# Patient Record
Sex: Female | Born: 1950 | Hispanic: No | Smoking: Former smoker
Health system: Southern US, Community
[De-identification: ages and names within clinical notes are randomized; demographics above are authoritative.]

## PROBLEM LIST (undated history)

## (undated) DIAGNOSIS — J309 Allergic rhinitis, unspecified: Secondary | ICD-10-CM

## (undated) DIAGNOSIS — F419 Anxiety disorder, unspecified: Secondary | ICD-10-CM

## (undated) DIAGNOSIS — I9789 Other postprocedural complications and disorders of the circulatory system, not elsewhere classified: Secondary | ICD-10-CM

## (undated) DIAGNOSIS — C3490 Malignant neoplasm of unspecified part of unspecified bronchus or lung: Secondary | ICD-10-CM

## (undated) DIAGNOSIS — E785 Hyperlipidemia, unspecified: Secondary | ICD-10-CM

## (undated) DIAGNOSIS — Z86711 Personal history of pulmonary embolism: Secondary | ICD-10-CM

## (undated) DIAGNOSIS — I10 Essential (primary) hypertension: Secondary | ICD-10-CM

## (undated) DIAGNOSIS — G4733 Obstructive sleep apnea (adult) (pediatric): Secondary | ICD-10-CM

## (undated) DIAGNOSIS — I4891 Unspecified atrial fibrillation: Secondary | ICD-10-CM

## (undated) DIAGNOSIS — J449 Chronic obstructive pulmonary disease, unspecified: Secondary | ICD-10-CM

## (undated) HISTORY — PX: OTHER SURGICAL HISTORY: SHX169

## (undated) HISTORY — PX: BREAST BIOPSY: SHX20

---

## 2011-07-28 HISTORY — PX: LOBECTOMY: SHX5089

## 2016-03-24 ENCOUNTER — Encounter: Payer: Self-pay | Admitting: Orthopaedic Surgery

## 2016-03-24 ENCOUNTER — Ambulatory Visit (INDEPENDENT_AMBULATORY_CARE_PROVIDER_SITE_OTHER): Payer: BLUE CROSS/BLUE SHIELD | Admitting: Orthopaedic Surgery

## 2016-03-24 VITALS — BP 124/84 | HR 116 | Temp 97.2°F | Ht 67.0 in | Wt 263.0 lb

## 2016-03-24 DIAGNOSIS — M25551 Pain in right hip: Secondary | ICD-10-CM | POA: Diagnosis not present

## 2016-03-24 MED ORDER — OXYCODONE-ACETAMINOPHEN 5-325 MG PO TABS: 1.0000 | ORAL_TABLET | ORAL | 0 refills | Status: DC | PRN

## 2016-03-24 NOTE — Progress Notes (Signed)
Subjective: My right hip hurts really bad    Patient ID: Anita Weeks, female    DOB: 01/25/1951, 65 y.o.   MRN: [id number]  HPI She has had about a month history of right hip pain.  She has no trauma.  She has been seen in [place] several times over the last month with x-rays done [id number] showing  Degenerative changes of both hips, no fracture.  She has been given Naprosyn, doses of prednisone in increasing doses up to 20 mgm dose packs with no help, heat/ice with no help, rubs with no help.  She can hardly stand at times secondary to hip pain.  She has no redness.  She has some pain to the mid thigh at times but mainly confined to the hip.  She is not getting any better.  She is tired of hurting.  She is not using a walker or crutches.  I have given Rx for Walker.  I have ordered MRI of the right hip to rule out possible avascular necrosis.  I have given pain medicine.  She works for [employer].   Review of Systems  HENT: Negative for congestion.   Respiratory: Negative for cough and shortness of breath.   Cardiovascular: Negative for chest pain and leg swelling.  Endocrine: Positive for cold intolerance.  Musculoskeletal: Positive for arthralgias, gait problem and joint swelling.  Allergic/Immunologic: Positive for environmental allergies.   No past medical history on file.  She has had surgery for stents for iliac arteries in past.  No current outpatient prescriptions on file prior to visit.   No current facility-administered medications on file prior to visit.     Social History   Social History  . Marital status: Widowed    Spouse name: N/A  . Number of children: N/A  . Years of education: N/A   Occupational History  . Not on file.   Social History Main Topics  . Smoking status: Never Smoker  . Smokeless tobacco: Never Used  . Alcohol use Not on file  . Drug use: Unknown  . Sexual activity: Not on file   Other Topics Concern  . Not on file   Social History  Narrative  . No narrative on file    History in family of heart disease and arthritis.  BP 124/84   Pulse (!) 116   Temp 97.2 F (36.2 C)   Ht '5\' 7"'$  (1.702 m)   Wt 263 lb (119.3 kg)   BMI 41.19 kg/m      Objective:   Physical Exam  Constitutional: She is oriented to person, place, and time. She appears well-developed and well-nourished.  HENT:  Head: Normocephalic and atraumatic.  Eyes: Conjunctivae and EOM are normal. Pupils are equal, round, and reactive to light.  Neck: Normal range of motion. Neck supple.  Cardiovascular: Normal rate, regular rhythm and intact distal pulses.   Pulmonary/Chest: Effort normal.  Abdominal: Soft.  Musculoskeletal: She exhibits tenderness (Right hip tender.  Limp to the right.  No redness.  NV intact.  Flexion 90, internal 5, external 10, abduction 30, adduction 30, pain to move hip on right.  Left hip negative.  Back negative.  ).  Neurological: She is alert and oriented to person, place, and time. She displays normal reflexes. No cranial nerve deficit. She exhibits normal muscle tone. Coordination normal.  Skin: Skin is warm and dry.  Psychiatric: She has a normal mood and affect. Her behavior is normal. Judgment and thought content normal.  I have reviewed the information from [place] and the x-rays and x-rays report.      Assessment & Plan:   Encounter Diagnosis  Name Primary?  . Right hip pain Yes   I have ordered a MRI to rule out possible avascular necrosis of the right hip early.  I have given pain medicine.  Precautions discussed.  Call if any problem.  Electronically Signed [name], MD 8/29/20174:00 PM

## 2016-03-31 ENCOUNTER — Other Ambulatory Visit: Payer: Self-pay | Admitting: *Deleted

## 2016-03-31 ENCOUNTER — Telehealth: Payer: Self-pay | Admitting: Orthopaedic Surgery

## 2016-03-31 ENCOUNTER — Ambulatory Visit: Payer: Medicare Other | Admitting: Orthopaedic Surgery

## 2016-03-31 DIAGNOSIS — M25551 Pain in right hip: Secondary | ICD-10-CM

## 2016-03-31 NOTE — Telephone Encounter (Signed)
Yes change to CT

## 2016-03-31 NOTE — Telephone Encounter (Signed)
Call from provider, [facility], Raquel Sarna, states unable to do MRI as scheduled 03/27/16, due to patient having stents; therefore, requests new order for CT, if Dr Luna Glasgow elects to have CT done.  Please advise - their phone# is  [phone], direct extension [phone]

## 2016-03-31 NOTE — Telephone Encounter (Signed)
WOULD YOU LIKE ME TO CHANGE ORDER TO CT?

## 2016-04-02 NOTE — Telephone Encounter (Signed)
Order changed and pre authorized and patient is aware to schedule

## 2016-04-07 ENCOUNTER — Telehealth: Payer: Self-pay | Admitting: Orthopaedic Surgery

## 2016-04-07 NOTE — Telephone Encounter (Signed)
Left message for patient that appointment has been scheduled for 04/09/16 at 5:00pm

## 2016-04-07 NOTE — Telephone Encounter (Signed)
Patient called and stated that [facility] would not let her set up her own appointment for CT.  Could you please do this and also give an appointment to return here.  Is she to go to [place] or [facility]?  Also, did you fax new CT order?  Thanks so much

## 2016-04-09 ENCOUNTER — Encounter: Payer: Self-pay | Admitting: Orthopaedic Surgery

## 2016-04-15 ENCOUNTER — Encounter: Payer: Self-pay | Admitting: Orthopaedic Surgery

## 2016-04-15 ENCOUNTER — Ambulatory Visit (INDEPENDENT_AMBULATORY_CARE_PROVIDER_SITE_OTHER): Payer: BLUE CROSS/BLUE SHIELD | Admitting: Orthopaedic Surgery

## 2016-04-15 ENCOUNTER — Ambulatory Visit (INDEPENDENT_AMBULATORY_CARE_PROVIDER_SITE_OTHER): Payer: Medicare Other

## 2016-04-15 VITALS — BP 139/73 | HR 83 | Temp 96.8°F | Ht 67.0 in | Wt 257.0 lb

## 2016-04-15 DIAGNOSIS — M25551 Pain in right hip: Secondary | ICD-10-CM

## 2016-04-15 MED ORDER — OXYCODONE-ACETAMINOPHEN 5-325 MG PO TABS: 1.0000 | ORAL_TABLET | Freq: Four times a day (QID) | ORAL | 0 refills | Status: AC | PRN

## 2016-04-15 NOTE — Progress Notes (Signed)
Patient Anita Weeks, female DOB:1951/04/02, 65 y.o. MEB:[id number]  Chief Complaint  Patient presents with  . Follow-up    right hip pain    HPI  Anita Weeks is a 65 y.o. female who has right hip pain. She had CT scan in [place] because she could not get a MRI with a stent in the artery.  CT scan showed DJD changes, no acute findings.  I have explained this to her.  She wants a lift chair, this was done. A Rx given.  Continue present medicine.  Use a walker or crutches.  Continue present medicine. HPI  Body mass index is 40.25 kg/m.  ROS  Review of Systems  HENT: Negative for congestion.   Respiratory: Negative for cough and shortness of breath.   Cardiovascular: Negative for chest pain and leg swelling.  Endocrine: Positive for cold intolerance.  Musculoskeletal: Positive for arthralgias, gait problem and joint swelling.  Allergic/Immunologic: Positive for environmental allergies.    No past medical history on file.  No past surgical history on file.  No family history on file.  Social History Social History  Substance Use Topics  . Smoking status: Never Smoker  . Smokeless tobacco: Never Used  . Alcohol use Not on file    No Known Allergies  Current Outpatient Prescriptions  Medication Sig Dispense Refill  . FUROSEMIDE PO Take by mouth.    Marland Kitchen MAGNESIUM PO Take by mouth.    . Multiple Vitamin (MULTIVITAMIN) tablet Take 1 tablet by mouth daily.    . Omega-3 Fatty Acids (FISH OIL PO) Take by mouth.    . OMEPRAZOLE PO Take by mouth.    . oxyCODONE-acetaminophen (PERCOCET/ROXICET) 5-325 MG tablet Take 1 tablet by mouth every 6 (six) hours as needed for moderate pain or severe pain (Must last 14 days.Do not drive or operate machinery while taking this medicine). 60 tablet 0  . POTASSIUM PO Take by mouth.     No current facility-administered medications for this visit.      Physical Exam  Blood pressure 139/73, pulse 83, temperature (!) 96.8 F (36 C),  height '5\' 7"'$  (1.702 m), weight 257 lb (116.6 kg).  Constitutional: overall normal hygiene, normal nutrition, well developed, normal grooming, normal body habitus. Assistive device:none  Musculoskeletal: gait and station Limp right, muscle tone and strength are normal, no tremors or atrophy is present.  .  Neurological: coordination overall normal.  Deep tendon reflex/nerve stretch intact.  Sensation normal.  Cranial nerves II-XII intact.   Skin:   Normal overall no scars, lesions, ulcers or rashes. No psoriasis.  Psychiatric: Alert and oriented x 3.  Recent memory intact, remote memory unclear.  Normal mood and affect. Well groomed.  Good eye contact.  Cardiovascular: overall no swelling, no varicosities, no edema bilaterally, normal temperatures of the legs and arms, no clubbing, cyanosis and good capillary refill.  Lymphatic: palpation is normal.  Hips have full ROM and no pain.  She limps to the right.  X-rays were done for leg length, reported separately.   The patient has been educated about the nature of the problem(s) and counseled on treatment options.  The patient appeared to understand what I have discussed and is in agreement with it.  Encounter Diagnosis  Name Primary?  . Right hip pain Yes    PLAN Call if any problems.  Precautions discussed.  Continue current medications.   Return to clinic 1 month   Electronically Signed [name], MD 9/20/20174:10 PM

## 2016-05-06 ENCOUNTER — Ambulatory Visit (INDEPENDENT_AMBULATORY_CARE_PROVIDER_SITE_OTHER): Payer: BLUE CROSS/BLUE SHIELD | Admitting: Orthopaedic Surgery

## 2016-05-06 VITALS — BP 124/81 | HR 84 | Temp 97.2°F | Resp 16 | Ht 67.0 in | Wt 257.0 lb

## 2016-05-06 DIAGNOSIS — M25551 Pain in right hip: Secondary | ICD-10-CM

## 2016-05-06 MED ORDER — PREDNISONE 10 MG (21) PO TBPK: ORAL_TABLET | ORAL | 1 refills | Status: AC

## 2016-05-06 NOTE — Progress Notes (Signed)
Patient Anita Weeks, female DOB:12/22/1950, 65 y.o. UUV:[id number]  Chief Complaint  Patient presents with  . Follow-up    Recheck on right hip.    HPI  Anita Weeks is a 65 y.o. female who has had more pain in the right hip.  She has no new trauma.  She has had CT of the hip which showed DJD changes, no fracture and no avascular necrosis.  She could not have a MRI as she has stent in artery.  She said she was doing fine until two days ago and her hip pain came back with a vengeance.  She has no new trauma.  She is in a wheelchair today. She is very emotional.  She says the pain medicine does not help and she is not taking it.  She is taking Mobic.  It helps some.  I will have her seen at [place] as she is not getting better and I do not know why she is having such pain.  I will try prednisone dose pack while awaiting her to see them. HPI  Body mass index is 40.25 kg/m.  ROS  Review of Systems  HENT: Negative for congestion.   Respiratory: Negative for cough and shortness of breath.   Cardiovascular: Negative for chest pain and leg swelling.  Endocrine: Positive for cold intolerance.  Musculoskeletal: Positive for arthralgias, gait problem and joint swelling.  Allergic/Immunologic: Positive for environmental allergies.    No past medical history on file.  No past surgical history on file.  Family history of heart disease and hypertension. Social History Social History  Substance Use Topics  . Smoking status: Never Smoker  . Smokeless tobacco: Never Used  . Alcohol use Not on file    No Known Allergies  Current Outpatient Prescriptions  Medication Sig Dispense Refill  . FUROSEMIDE PO Take by mouth.    Marland Kitchen MAGNESIUM PO Take by mouth.    . Multiple Vitamin (MULTIVITAMIN) tablet Take 1 tablet by mouth daily.    . Omega-3 Fatty Acids (FISH OIL PO) Take by mouth.    . OMEPRAZOLE PO Take by mouth.    . oxyCODONE-acetaminophen (PERCOCET/ROXICET) 5-325 MG tablet Take 1  tablet by mouth every 6 (six) hours as needed for moderate pain or severe pain (Must last 14 days.Do not drive or operate machinery while taking this medicine). 60 tablet 0  . POTASSIUM PO Take by mouth.    . predniSONE (STERAPRED UNI-PAK 21 TAB) 10 MG (21) TBPK tablet Take six pills the first day; then 5 pills the next day; then 4 pills the next day; then 3 pills the next day; then 2 pills the next day and the last day take one pill by mouth. 21 tablet 1   No current facility-administered medications for this visit.      Physical Exam  Blood pressure 124/81, pulse 84, temperature 97.2 F (36.2 C), resp. rate 16, height '5\' 7"'$  (1.702 m), weight 257 lb (116.6 kg).  Constitutional: overall normal hygiene, normal nutrition, well developed, normal grooming, normal body habitus. Assistive device:wheelchair  Musculoskeletal: gait and station Limp she cannot stand right hip hurts too much, muscle tone and strength are normal, no tremors or atrophy is present.  .  Neurological: coordination overall normal.  Deep tendon reflex/nerve stretch intact.  Sensation normal.  Cranial nerves II-XII intact.   Skin:   Normal overall no scars, lesions, ulcers or rashes. No psoriasis.  Psychiatric: Alert and oriented x 3.  Recent memory intact, remote  memory unclear.  Normal mood and affect. Well groomed.  Good eye contact.  Cardiovascular: overall no swelling, no varicosities, no edema bilaterally, normal temperatures of the legs and arms, no clubbing, cyanosis and good capillary refill.  Lymphatic: palpation is normal.  Right hip has good motion in wheelchair but she refuses to stand secondary to pain.  NV intact.  She has no distal edema.  She has no knee pain. She has no back pain.  The patient has been educated about the nature of the problem(s) and counseled on treatment options.  The patient appeared to understand what I have discussed and is in agreement with it.  Encounter Diagnosis  Name  Primary?  . Right hip pain Yes    PLAN Call if any problems.  Precautions discussed.  Continue current medications.   Return to clinic to [place]   Electronically Young, MD 10/11/20174:10 PM

## 2016-05-22 ENCOUNTER — Emergency Department (HOSPITAL_COMMUNITY): Payer: Medicare Other

## 2016-05-22 ENCOUNTER — Inpatient Hospital Stay (HOSPITAL_COMMUNITY)
Admission: AD | Admit: 2016-05-22 | Discharge: 2016-05-27 | DRG: 308 | Disposition: A | Discharge status: Home Health | Payer: Medicare Other | Attending: Internal Medicine | Admitting: Internal Medicine

## 2016-05-22 ENCOUNTER — Inpatient Hospital Stay (HOSPITAL_COMMUNITY): Payer: Medicare Other

## 2016-05-22 ENCOUNTER — Encounter (HOSPITAL_COMMUNITY): Payer: Self-pay | Admitting: Emergency Medicine

## 2016-05-22 DIAGNOSIS — I10 Essential (primary) hypertension: Secondary | ICD-10-CM | POA: Diagnosis present

## 2016-05-22 DIAGNOSIS — I4891 Unspecified atrial fibrillation: Secondary | ICD-10-CM | POA: Diagnosis not present

## 2016-05-22 DIAGNOSIS — I5043 Acute on chronic combined systolic (congestive) and diastolic (congestive) heart failure: Secondary | ICD-10-CM | POA: Diagnosis present

## 2016-05-22 DIAGNOSIS — J810 Acute pulmonary edema: Secondary | ICD-10-CM | POA: Diagnosis not present

## 2016-05-22 DIAGNOSIS — N179 Acute kidney failure, unspecified: Secondary | ICD-10-CM | POA: Diagnosis present

## 2016-05-22 DIAGNOSIS — Z79899 Other long term (current) drug therapy: Secondary | ICD-10-CM | POA: Diagnosis not present

## 2016-05-22 DIAGNOSIS — I5041 Acute combined systolic (congestive) and diastolic (congestive) heart failure: Secondary | ICD-10-CM

## 2016-05-22 DIAGNOSIS — M879 Osteonecrosis, unspecified: Secondary | ICD-10-CM | POA: Diagnosis present

## 2016-05-22 DIAGNOSIS — Z7951 Long term (current) use of inhaled steroids: Secondary | ICD-10-CM | POA: Diagnosis not present

## 2016-05-22 DIAGNOSIS — Z87891 Personal history of nicotine dependence: Secondary | ICD-10-CM | POA: Diagnosis not present

## 2016-05-22 DIAGNOSIS — I110 Hypertensive heart disease with heart failure: Secondary | ICD-10-CM | POA: Diagnosis present

## 2016-05-22 DIAGNOSIS — E669 Obesity, unspecified: Secondary | ICD-10-CM | POA: Diagnosis present

## 2016-05-22 DIAGNOSIS — J449 Chronic obstructive pulmonary disease, unspecified: Secondary | ICD-10-CM | POA: Diagnosis present

## 2016-05-22 DIAGNOSIS — Z6841 Body Mass Index (BMI) 40.0 and over, adult: Secondary | ICD-10-CM

## 2016-05-22 DIAGNOSIS — E876 Hypokalemia: Secondary | ICD-10-CM | POA: Diagnosis present

## 2016-05-22 DIAGNOSIS — Z8249 Family history of ischemic heart disease and other diseases of the circulatory system: Secondary | ICD-10-CM | POA: Diagnosis not present

## 2016-05-22 DIAGNOSIS — Z86711 Personal history of pulmonary embolism: Secondary | ICD-10-CM | POA: Diagnosis not present

## 2016-05-22 DIAGNOSIS — G4733 Obstructive sleep apnea (adult) (pediatric): Secondary | ICD-10-CM | POA: Diagnosis present

## 2016-05-22 DIAGNOSIS — Z823 Family history of stroke: Secondary | ICD-10-CM | POA: Diagnosis not present

## 2016-05-22 DIAGNOSIS — I509 Heart failure, unspecified: Secondary | ICD-10-CM

## 2016-05-22 DIAGNOSIS — Z85118 Personal history of other malignant neoplasm of bronchus and lung: Secondary | ICD-10-CM | POA: Diagnosis not present

## 2016-05-22 DIAGNOSIS — I481 Persistent atrial fibrillation: Secondary | ICD-10-CM

## 2016-05-22 DIAGNOSIS — E7800 Pure hypercholesterolemia, unspecified: Secondary | ICD-10-CM | POA: Diagnosis present

## 2016-05-22 DIAGNOSIS — I482 Chronic atrial fibrillation: Principal | ICD-10-CM | POA: Diagnosis present

## 2016-05-22 DIAGNOSIS — F419 Anxiety disorder, unspecified: Secondary | ICD-10-CM | POA: Diagnosis present

## 2016-05-22 DIAGNOSIS — Z23 Encounter for immunization: Secondary | ICD-10-CM | POA: Diagnosis not present

## 2016-05-22 DIAGNOSIS — I5031 Acute diastolic (congestive) heart failure: Secondary | ICD-10-CM | POA: Diagnosis not present

## 2016-05-22 DIAGNOSIS — J45909 Unspecified asthma, uncomplicated: Secondary | ICD-10-CM

## 2016-05-22 DIAGNOSIS — R0602 Shortness of breath: Secondary | ICD-10-CM | POA: Diagnosis present

## 2016-05-22 HISTORY — DX: Anxiety disorder, unspecified: F41.9

## 2016-05-22 HISTORY — DX: Hyperlipidemia, unspecified: E78.5

## 2016-05-22 HISTORY — DX: Unspecified atrial fibrillation: I48.91

## 2016-05-22 HISTORY — DX: Allergic rhinitis, unspecified: J30.9

## 2016-05-22 HISTORY — DX: Personal history of pulmonary embolism: Z86.711

## 2016-05-22 HISTORY — DX: Unspecified atrial fibrillation: I97.89

## 2016-05-22 HISTORY — DX: Malignant neoplasm of unspecified part of unspecified bronchus or lung: C34.90

## 2016-05-22 HISTORY — DX: Chronic obstructive pulmonary disease, unspecified: J44.9

## 2016-05-22 HISTORY — DX: Essential (primary) hypertension: I10

## 2016-05-22 HISTORY — DX: Obstructive sleep apnea (adult) (pediatric): G47.33

## 2016-05-22 LAB — PROTIME-INR
INR: 0.95
Prothrombin Time: 12.7 seconds (ref 11.4–15.2)

## 2016-05-22 LAB — CBC WITH DIFFERENTIAL/PLATELET
Basophils Absolute: 0 10*3/uL (ref 0.0–0.1)
Basophils Relative: 0 %
Eosinophils Absolute: 0.1 10*3/uL (ref 0.0–0.7)
Eosinophils Relative: 1 %
HEMATOCRIT: 41.1 % (ref 36.0–46.0)
Hemoglobin: 13.9 g/dL (ref 12.0–15.0)
LYMPHS PCT: 20 %
Lymphs Abs: 1.6 10*3/uL (ref 0.7–4.0)
MCH: 31.1 pg (ref 26.0–34.0)
MCHC: 33.8 g/dL (ref 30.0–36.0)
MCV: 91.9 fL (ref 78.0–100.0)
MONO ABS: 0.5 10*3/uL (ref 0.1–1.0)
MONOS PCT: 7 %
NEUTROS ABS: 5.7 10*3/uL (ref 1.7–7.7)
Neutrophils Relative %: 72 %
Platelets: 273 10*3/uL (ref 150–400)
RBC: 4.47 MIL/uL (ref 3.87–5.11)
RDW: 14.6 % (ref 11.5–15.5)
WBC: 7.9 10*3/uL (ref 4.0–10.5)

## 2016-05-22 LAB — HEPATIC FUNCTION PANEL
ALT: 57 U/L — ABNORMAL HIGH (ref 14–54)
AST: 61 U/L — AB (ref 15–41)
Albumin: 3.2 g/dL — ABNORMAL LOW (ref 3.5–5.0)
Alkaline Phosphatase: 94 U/L (ref 38–126)
BILIRUBIN DIRECT: 0.1 mg/dL (ref 0.1–0.5)
BILIRUBIN TOTAL: 0.8 mg/dL (ref 0.3–1.2)
Indirect Bilirubin: 0.7 mg/dL (ref 0.3–0.9)
Total Protein: 6.4 g/dL — ABNORMAL LOW (ref 6.5–8.1)

## 2016-05-22 LAB — BASIC METABOLIC PANEL
Anion gap: 6 (ref 5–15)
BUN: 16 mg/dL (ref 6–20)
CALCIUM: 8.7 mg/dL — AB (ref 8.9–10.3)
CO2: 26 mmol/L (ref 22–32)
CREATININE: 1.12 mg/dL — AB (ref 0.44–1.00)
Chloride: 107 mmol/L (ref 101–111)
GFR calc Af Amer: 58 mL/min — ABNORMAL LOW (ref >60)
GFR calc non Af Amer: 50 mL/min — ABNORMAL LOW (ref >60)
GLUCOSE: 114 mg/dL — AB (ref 65–99)
Potassium: 3.2 mmol/L — ABNORMAL LOW (ref 3.5–5.1)
Sodium: 139 mmol/L (ref 135–145)

## 2016-05-22 LAB — TROPONIN I
Troponin I: <0.03 ng/mL (ref <0.03)
Troponin I: <0.03 ng/mL (ref <0.03)
Troponin I: <0.03 ng/mL (ref <0.03)

## 2016-05-22 LAB — TSH: TSH: 0.955 u[IU]/mL (ref 0.350–4.500)

## 2016-05-22 MED ORDER — ACETAMINOPHEN 325 MG PO TABS: 650.0000 mg | ORAL_TABLET | Freq: Four times a day (QID) | ORAL | Status: DC | PRN

## 2016-05-22 MED ORDER — ACETAMINOPHEN 650 MG RE SUPP
650.0000 mg | Freq: Four times a day (QID) | RECTAL | Status: DC | PRN
Start: 2016-05-22 — End: 2016-05-27

## 2016-05-22 MED ORDER — PANTOPRAZOLE SODIUM 40 MG PO TBEC
40.0000 mg | DELAYED_RELEASE_TABLET | Freq: Every day | ORAL | Status: DC
  Administered 2016-05-22 – 2016-05-27 (×6): 40 mg via ORAL
  Filled 2016-05-22 (×6): qty 1

## 2016-05-22 MED ORDER — SODIUM CHLORIDE 0.9% FLUSH
3.0000 mL | Freq: Two times a day (BID) | INTRAVENOUS | Status: DC
  Administered 2016-05-24 – 2016-05-27 (×6): 3 mL via INTRAVENOUS

## 2016-05-22 MED ORDER — ALBUTEROL SULFATE (2.5 MG/3ML) 0.083% IN NEBU
5.0000 mg | INHALATION_SOLUTION | Freq: Once | RESPIRATORY_TRACT | Status: DC
  Filled 2016-05-22: qty 6

## 2016-05-22 MED ORDER — ADULT MULTIVITAMIN W/MINERALS CH
1.0000 | ORAL_TABLET | Freq: Every day | ORAL | Status: DC
  Administered 2016-05-23 – 2016-05-27 (×5): 1 via ORAL
  Filled 2016-05-22 (×5): qty 1

## 2016-05-22 MED ORDER — POTASSIUM CHLORIDE CRYS ER 20 MEQ PO TBCR
40.0000 meq | EXTENDED_RELEASE_TABLET | Freq: Every day | ORAL | Status: DC
  Administered 2016-05-22 – 2016-05-24 (×3): 40 meq via ORAL
  Filled 2016-05-22 (×3): qty 2

## 2016-05-22 MED ORDER — IPRATROPIUM BROMIDE 0.02 % IN SOLN
0.5000 mg | Freq: Once | RESPIRATORY_TRACT | Status: DC
  Filled 2016-05-22: qty 2.5

## 2016-05-22 MED ORDER — UMECLIDINIUM BROMIDE 62.5 MCG/INH IN AEPB
1.0000 | INHALATION_SPRAY | Freq: Every day | RESPIRATORY_TRACT | Status: DC
  Administered 2016-05-23 – 2016-05-27 (×5): 1 via RESPIRATORY_TRACT
  Filled 2016-05-22: qty 7

## 2016-05-22 MED ORDER — CARVEDILOL 12.5 MG PO TABS
12.5000 mg | ORAL_TABLET | Freq: Two times a day (BID) | ORAL | Status: DC
  Administered 2016-05-23 – 2016-05-25 (×5): 12.5 mg via ORAL
  Filled 2016-05-22 (×5): qty 1

## 2016-05-22 MED ORDER — POLYETHYL GLYCOL-PROPYL GLYCOL 0.4-0.3 % OP GEL
Freq: Every day | OPHTHALMIC | Status: DC
  Filled 2016-05-22: qty 10

## 2016-05-22 MED ORDER — POTASSIUM 99 MG PO TABS: 1.0000 | ORAL_TABLET | Freq: Every day | ORAL | Status: DC

## 2016-05-22 MED ORDER — ONDANSETRON HCL 4 MG/2ML IJ SOLN: 4.0000 mg | Freq: Four times a day (QID) | INTRAMUSCULAR | Status: DC | PRN

## 2016-05-22 MED ORDER — INFLUENZA VAC SPLIT QUAD 0.5 ML IM SUSY
0.5000 mL | PREFILLED_SYRINGE | INTRAMUSCULAR | Status: AC
  Administered 2016-05-23: 0.5 mL via INTRAMUSCULAR
  Filled 2016-05-22: qty 0.5

## 2016-05-22 MED ORDER — CARVEDILOL 12.5 MG PO TABS
12.5000 mg | ORAL_TABLET | Freq: Two times a day (BID) | ORAL | Status: DC
  Administered 2016-05-22: 12.5 mg via ORAL
  Filled 2016-05-22: qty 1

## 2016-05-22 MED ORDER — SODIUM CHLORIDE 0.9 % IV SOLN
INTRAVENOUS | Status: DC
  Administered 2016-05-22: 16:00:00 via INTRAVENOUS

## 2016-05-22 MED ORDER — NAPHAZOLINE-GLYCERIN 0.012-0.2 % OP SOLN
1.0000 [drp] | Freq: Four times a day (QID) | OPHTHALMIC | Status: DC | PRN
  Filled 2016-05-22: qty 15

## 2016-05-22 MED ORDER — POTASSIUM CHLORIDE CRYS ER 20 MEQ PO TBCR
40.0000 meq | EXTENDED_RELEASE_TABLET | Freq: Once | ORAL | Status: AC
  Administered 2016-05-22: 40 meq via ORAL
  Filled 2016-05-22: qty 2

## 2016-05-22 MED ORDER — DILTIAZEM HCL 100 MG IV SOLR
INTRAVENOUS | Status: AC
  Administered 2016-05-22: 5 mg/h via INTRAVENOUS
  Filled 2016-05-22: qty 100

## 2016-05-22 MED ORDER — APIXABAN 5 MG PO TABS
5.0000 mg | ORAL_TABLET | Freq: Two times a day (BID) | ORAL | Status: DC
  Administered 2016-05-22 – 2016-05-27 (×10): 5 mg via ORAL
  Filled 2016-05-22 (×10): qty 1

## 2016-05-22 MED ORDER — FUROSEMIDE 10 MG/ML IJ SOLN
40.0000 mg | Freq: Two times a day (BID) | INTRAMUSCULAR | Status: DC
  Administered 2016-05-22 – 2016-05-24 (×4): 40 mg via INTRAVENOUS
  Filled 2016-05-22 (×4): qty 4

## 2016-05-22 MED ORDER — MAGNESIUM OXIDE 400 (241.3 MG) MG PO TABS
200.0000 mg | ORAL_TABLET | Freq: Every day | ORAL | Status: DC
Start: 2016-05-23 — End: 2016-05-23
  Administered 2016-05-23: 200 mg via ORAL
  Filled 2016-05-22: qty 1

## 2016-05-22 MED ORDER — OMEGA-3-ACID ETHYL ESTERS 1 G PO CAPS
1.0000 g | ORAL_CAPSULE | Freq: Every day | ORAL | Status: DC
  Administered 2016-05-23 – 2016-05-27 (×5): 1 g via ORAL
  Filled 2016-05-22 (×5): qty 1

## 2016-05-22 MED ORDER — HYDROCODONE-ACETAMINOPHEN 5-325 MG PO TABS
1.0000 | ORAL_TABLET | ORAL | Status: DC | PRN
  Administered 2016-05-25 – 2016-05-26 (×3): 1 via ORAL
  Filled 2016-05-22 (×3): qty 1

## 2016-05-22 MED ORDER — DILTIAZEM LOAD VIA INFUSION
10.0000 mg | Freq: Once | INTRAVENOUS | Status: AC
  Administered 2016-05-22: 10 mg via INTRAVENOUS
  Filled 2016-05-22: qty 10

## 2016-05-22 MED ORDER — HEPARIN SODIUM (PORCINE) 5000 UNIT/ML IJ SOLN
5000.0000 [IU] | Freq: Three times a day (TID) | INTRAMUSCULAR | Status: DC
  Administered 2016-05-22: 5000 [IU] via SUBCUTANEOUS
  Filled 2016-05-22: qty 1

## 2016-05-22 MED ORDER — ONDANSETRON HCL 4 MG PO TABS: 4.0000 mg | ORAL_TABLET | Freq: Four times a day (QID) | ORAL | Status: DC | PRN

## 2016-05-22 MED ORDER — DILTIAZEM HCL 100 MG IV SOLR
5.0000 mg/h | INTRAVENOUS | Status: DC
  Administered 2016-05-22 (×3): 5 mg/h via INTRAVENOUS
  Administered 2016-05-23: 10 mg/h via INTRAVENOUS
  Filled 2016-05-22 (×2): qty 100

## 2016-05-22 NOTE — H&P (Signed)
History and Physical    Anita Weeks:[id number] DOB: [birth year] DOA: 05/22/2016  PCP: No PCP Per Patient  Patient coming from: Home  Chief Complaint: SOB  HPI: Anita Weeks is a 65 y.o. female with medical history significant of LLL NSCLC with removal of 2013, has COPD, ex-smoker came to the hospital complaining about shortness of breath. Patient reported that she was having SOB for the past several days, seen in urgent care center several days ago, continues to have SOB and lower extremity edema so she came in today from work for further evaluation. She was found to be on A. fib with RVR.  ED Course:  Vitals: Tachycardia otherwise WNL Labs: Potassium of 3.2, creatinine is 1.12 Imaging: CXR showed bilateral pleural fluids Interventions: Started on Cardizem drip  Review of Systems:  Constitutional: negative for anorexia, fevers and sweats Eyes: negative for irritation, redness and visual disturbance Ears, nose, mouth, throat, and face: negative for earaches, epistaxis, nasal congestion and sore throat Respiratory: negative for cough, dyspnea on exertion, sputum and wheezing Cardiovascular: SOB, lower extremity edema Gastrointestinal: negative for abdominal pain, constipation, diarrhea, melena, nausea and vomiting Genitourinary:negative for dysuria, frequency and hematuria Hematologic/lymphatic: negative for bleeding, easy bruising and lymphadenopathy Musculoskeletal:negative for arthralgias, muscle weakness and stiff joints Neurological: negative for coordination problems, gait problems, headaches and weakness Endocrine: negative for diabetic symptoms including polydipsia, polyuria and weight loss Allergic/Immunologic: negative for anaphylaxis, hay fever and urticaria  Past Medical History:  Diagnosis Date  . COPD (chronic obstructive pulmonary disease) ([place])   . High cholesterol   . Hypertension     Past Surgical History:  Procedure Laterality Date  . CESAREAN SECTION      . LOBECTOMY Left 2013   lower     reports that she has quit smoking. She has never used smokeless tobacco. She reports that she does not drink alcohol or use drugs.  No Known Allergies  Family history No premature cardiac disease  Prior to Admission medications   Medication Sig Start Date End Date Taking? Authorizing Provider  amLODipine (NORVASC) 5 MG tablet 1 tablet daily. 05/11/16  Yes Historical Provider, MD  fluticasone furoate-vilanterol (BREO ELLIPTA) 200-25 MCG/INH AEPB 1 puff daily. 04/16/16  Yes Historical Provider, MD  losartan (COZAAR) 100 MG tablet 1 tablet daily. 05/11/16  Yes Historical Provider, MD  MAGNESIUM PO Take 100 mg by mouth daily.    Yes Historical Provider, MD  metoprolol (LOPRESSOR) 50 MG tablet 1 tablet 3 (three) times daily. 05/10/16  Yes Historical Provider, MD  Multiple Vitamin (MULTIVITAMIN) tablet Take 1 tablet by mouth daily.   Yes Historical Provider, MD  Omega-3 Fatty Acids (FISH OIL PO) Take 1 tablet by mouth daily.    Yes Historical Provider, MD  omeprazole (PRILOSEC) 20 MG capsule Take 1 capsule by mouth at bedtime.    Yes Historical Provider, MD  Polyethyl Glycol-Propyl Glycol (SYSTANE OP) Apply 1 drop to eye daily.   Yes Historical Provider, MD  Potassium 99 MG TABS Take 1 tablet by mouth daily.    Yes Historical Provider, MD  umeclidinium bromide (INCRUSE ELLIPTA) 62.5 MCG/INH AEPB 1 puff daily. 04/15/16  Yes Historical Provider, MD  oxyCODONE-acetaminophen (PERCOCET/ROXICET) 5-325 MG tablet Take 1 tablet by mouth every 6 (six) hours as needed for moderate pain or severe pain (Must last 14 days.Do not drive or operate machinery while taking this medicine). 04/15/16   [name], MD  predniSONE (STERAPRED UNI-PAK 21 TAB) 10 MG (21) TBPK tablet Take six pills  the first day; then 5 pills the next day; then 4 pills the next day; then 3 pills the next day; then 2 pills the next day and the last day take one pill by mouth. 05/06/16   [name], MD     Physical Exam:  Vitals:   05/22/16 1305 05/22/16 1330 05/22/16 1414 05/22/16 1430  BP: (!) 139/117 153/98 125/81 129/83  Pulse: 116 95 103 102  Resp: '23 23 24 23  '$ Temp:      SpO2: 95% 95% 97% 93%  Weight:      Height:        Constitutional: NAD, calm, comfortable Eyes: PERRL, lids and conjunctivae normal ENMT: Mucous membranes are moist. Posterior pharynx clear of any exudate or lesions.Normal dentition.  Neck: normal, supple, no masses, no thyromegaly Respiratory: clear to auscultation bilaterally, no wheezing, no crackles. Normal respiratory effort. No accessory muscle use.  Cardiovascular: Regular rate and rhythm, no murmurs / rubs / gallops. No extremity edema. 2+ pedal pulses. No carotid bruits.  Abdomen: no tenderness, no masses palpated. No hepatosplenomegaly. Bowel sounds positive.  Musculoskeletal: no clubbing / cyanosis. No joint deformity upper and lower extremities. Good ROM, no contractures. Normal muscle tone.  Skin: no rashes, lesions, ulcers. No induration Neurologic: CN 2-12 grossly intact. Sensation intact, DTR normal. Strength 5/5 in all 4.  Psychiatric: Normal judgment and insight. Alert and oriented x 3. Normal mood.   Labs on Admission: I have personally reviewed following labs and imaging studies  CBC:  Recent Labs Lab 05/22/16 1149  WBC 7.9  NEUTROABS 5.7  HGB 13.9  HCT 41.1  MCV 91.9  PLT 176   Basic Metabolic Panel:  Recent Labs Lab 05/22/16 1149  NA 139  K 3.2*  CL 107  CO2 26  GLUCOSE 114*  BUN 16  CREATININE 1.12*  CALCIUM 8.7*   GFR: Estimated Creatinine Clearance: 64 mL/min (by C-G formula based on SCr of 1.12 mg/dL (H)). Liver Function Tests:  Recent Labs Lab 05/22/16 1205  AST 61*  ALT 57*  ALKPHOS 94  BILITOT 0.8  PROT 6.4*  ALBUMIN 3.2*   No results for input(s): LIPASE, AMYLASE in the last 168 hours. No results for input(s): AMMONIA in the last 168 hours. Coagulation Profile: No results for input(s): INR,  PROTIME in the last 168 hours. Cardiac Enzymes:  Recent Labs Lab 05/22/16 1205  TROPONINI <0.03   BNP (last 3 results) No results for input(s): PROBNP in the last 8760 hours. HbA1C: No results for input(s): HGBA1C in the last 72 hours. CBG: No results for input(s): GLUCAP in the last 168 hours. Lipid Profile: No results for input(s): CHOL, HDL, LDLCALC, TRIG, CHOLHDL, LDLDIRECT in the last 72 hours. Thyroid Function Tests: No results for input(s): TSH, T4TOTAL, FREET4, T3FREE, THYROIDAB in the last 72 hours. Anemia Panel: No results for input(s): VITAMINB12, FOLATE, FERRITIN, TIBC, IRON, RETICCTPCT in the last 72 hours. Urine analysis: No results found for: COLORURINE, APPEARANCEUR, LABSPEC, PHURINE, GLUCOSEU, HGBUR, BILIRUBINUR, KETONESUR, PROTEINUR, UROBILINOGEN, NITRITE, LEUKOCYTESUR Sepsis Labs: !!!!!!!!!!!!!!!!!!!!!!!!!!!!!!!!!!!!!!!!!!!! Invalid input(s): PROCALCITONIN, LACTICIDVEN No results found for this or any previous visit (from the past 240 hour(s)).   Radiological Exams on Admission: Dg Chest 2 View  Result Date: 05/22/2016 CLINICAL DATA:  Shortness of breath since last night with productive cough. EXAM: CHEST  2 VIEW COMPARISON:  None. FINDINGS: Lungs are adequately inflated and demonstrate small amount of bilateral pleural fluid. There is patchy airspace opacification over the right middle lobe/ lingula possibly a pneumonia. Mild  prominence of the perihilar markings. Mild cardiomegaly. Minimal calcified plaque over the aortic arch. There are degenerative changes of the spine. IMPRESSION: Patchy midlung opacification suggesting a pneumonia. Small amount of bilateral pleural fluid. Mild cardiomegaly with suggestion mild vascular congestion. Electronically Signed   By: Marin Olp M.D.   On: 05/22/2016 12:16    EKG: Independently reviewed.   Assessment/Plan Principal Problem:   Atrial fibrillation (HCC) Active Problems:   CHF (congestive heart failure) (HCC)    Acute pulmonary edema (HCC)   History of lung cancer   Asthma   Essential hypertension    Atrial fibrillation -Presented with A. fib with RVR, heart rate was up to 125. -Patient started on Cardizem drip in the emergency department, admitted to stepdown for further management. -Check 2-D echocardiogram, TSH and treat fluid overload.  Acute congestive heart failure -New-onset bibasilar pulmonary edema, lower extremity edema consistent with acute CHF. Check BMP. -Started on IV Lasix, follow intake/output closely. Continue metoprolol -Check 2-D echocardiogram. Serial cardiac enzymes to rule out ACS. -Daily weight, follow renal function closely.  Acute bibasilar pulmonary edema -This is likely secondary to acute congestive heart failure, patient started on diuresis. -Check 2-D echo.  Essential hypertension -Continue metoprolol, hold Cozaar because of concurrent diuresis. -Hold Norvasc, patient is on Cardizem drip.  History of lung cancer -Follow-up with Dr. Wynelle Cleveland as outpatient, no recent problems.   DVT prophylaxis: SQ Heparin Code Status: full code Family Communication: Plan D/W patient in presence of her friend at bedside Disposition Plan: [place] called:  Admission status: Stepdown, inpatient   [facility] A MD [facility] Pager [phone]  If 7PM-7AM, please contact night-coverage [email or web address] Password [email or web address]  05/22/2016, 3:02 PM

## 2016-05-22 NOTE — ED Provider Notes (Signed)
[place] DEPT Provider Note   CSN: [id number] Arrival date & time: 05/22/16  1139  By signing my name below, I, Anita Weeks, attest that this documentation has been prepared under the direction and in the presence of Anita Ferguson, MD. Electronically Signed: Rayna Weeks, ED Scribe. 05/22/16. 11:55 AM.   History   Chief Complaint Chief Complaint  Patient presents with  . Shortness of Breath    HPI HPI Comments: Anita Weeks is a 65 y.o. female with a h/o COPD who presents to the Emergency Department complaining of worsening, moderate, SOB x 1 day. She has been using her nebulizer and albuterol inhaler prn w/o significant relief. Pt also notes palpitations but states she has been dx with a-fib in the past and that the "feeling is nml for her". She reports taking medications for her a-fib but is not on anticoagulation, including aspirin. Pt has a f/u appointment with her cardiologist on 05/26/2016. No other associated symptoms at this time.   Cardiologist: Dr. Orpah Greek [facility])  The history is provided by the patient and medical records. No language interpreter was used.  Shortness of Breath  This is a chronic problem. The average episode lasts 1 day. The current episode started yesterday. The problem has been gradually worsening. Pertinent negatives include no headaches, no cough, no chest pain, no abdominal pain and no rash. Treatments tried: albuterol and nebulizer treatments. The treatment provided no relief. Associated medical issues include COPD.    Past Medical History:  Diagnosis Date  . COPD (chronic obstructive pulmonary disease) ([place])   . High cholesterol   . Hypertension     There are no active problems to display for this patient.   Past Surgical History:  Procedure Laterality Date  . CESAREAN SECTION    . LOBECTOMY Left 2013   lower    OB History    No data available     Home Medications    Prior to Admission medications   Medication  Sig Start Date End Date Taking? Authorizing Provider  FUROSEMIDE PO Take by mouth.    Historical Provider, MD  MAGNESIUM PO Take by mouth.    Historical Provider, MD  Multiple Vitamin (MULTIVITAMIN) tablet Take 1 tablet by mouth daily.    Historical Provider, MD  Omega-3 Fatty Acids (FISH OIL PO) Take by mouth.    Historical Provider, MD  OMEPRAZOLE PO Take by mouth.    Historical Provider, MD  oxyCODONE-acetaminophen (PERCOCET/ROXICET) 5-325 MG tablet Take 1 tablet by mouth every 6 (six) hours as needed for moderate pain or severe pain (Must last 14 days.Do not drive or operate machinery while taking this medicine). 04/15/16   [name], MD  POTASSIUM PO Take by mouth.    Historical Provider, MD  predniSONE (STERAPRED UNI-PAK 21 TAB) 10 MG (21) TBPK tablet Take six pills the first day; then 5 pills the next day; then 4 pills the next day; then 3 pills the next day; then 2 pills the next day and the last day take one pill by mouth. 05/06/16   [name], MD    Family History No family history on file.  Social History Social History  Substance Use Topics  . Smoking status: Former Research scientist (life sciences)  . Smokeless tobacco: Never Used  . Alcohol use No     Allergies   Review of patient's allergies indicates no known allergies.   Review of Systems Review of Systems  Constitutional: Negative for appetite change and fatigue.  HENT: Negative for congestion,  ear discharge and sinus pressure.   Eyes: Negative for discharge.  Respiratory: Positive for shortness of breath. Negative for cough.   Cardiovascular: Positive for palpitations. Negative for chest pain.  Gastrointestinal: Negative for abdominal pain and diarrhea.  Genitourinary: Negative for frequency and hematuria.  Musculoskeletal: Negative for back pain.  Skin: Negative for rash.  Neurological: Negative for seizures and headaches.  Psychiatric/Behavioral: Negative for hallucinations.   Physical Exam Updated Vital Signs BP (!)  156/118   Pulse (!) 157   Temp 97.5 F (36.4 C)   Resp (!) 35   Ht '5\' 7"'$  (1.702 m)   Wt 255 lb (115.7 kg)   SpO2 93%   BMI 39.94 kg/m   Physical Exam  Constitutional: She is oriented to person, place, and time. She appears well-developed.  HENT:  Head: Normocephalic.  Eyes: Conjunctivae and EOM are normal. No scleral icterus.  Neck: Neck supple. No thyromegaly present.  Cardiovascular: An irregularly irregular rhythm present. Tachycardia present.  Exam reveals no gallop and no friction rub.   No murmur heard. Pulmonary/Chest: No stridor. She has no wheezes. She has no rales. She exhibits no tenderness.  Abdominal: She exhibits no distension. There is no tenderness. There is no rebound.  Musculoskeletal: Normal range of motion. She exhibits no edema.  Lymphadenopathy:    She has no cervical adenopathy.  Neurological: She is oriented to person, place, and time. She exhibits normal muscle tone. Coordination normal.  Skin: No rash noted. No erythema.  Psychiatric: She has a normal mood and affect. Her behavior is normal.   ED Treatments / Results  Labs (all labs ordered are listed, but only abnormal results are displayed) Labs Reviewed  CBC WITH DIFFERENTIAL/PLATELET  BASIC METABOLIC PANEL    EKG  EKG Interpretation None       Radiology No results found.  Procedures Procedures  DIAGNOSTIC STUDIES: Oxygen Saturation is 93% on RA, adequate by my interpretation.    COORDINATION OF CARE: 11:52 AM Discussed next steps with pt. Pt verbalized understanding and is agreeable with the plan.    Medications Ordered in ED Medications  albuterol (PROVENTIL) (2.5 MG/3ML) 0.083% nebulizer solution 5 mg (not administered)  ipratropium (ATROVENT) nebulizer solution 0.5 mg (not administered)    Initial Impression / Assessment and Plan / ED Course  I have reviewed the triage vital signs and the nursing notes.  Pertinent labs & imaging results that were available during my care  of the patient were reviewed by me and considered in my medical decision making (see chart for details).  Clinical Course  CRITICAL CARE Performed by: Menna Abeln L Total critical care time: 45 minutes Critical care time was exclusive of separately billable procedures and treating other patients. Critical care was necessary to treat or prevent imminent or life-threatening deterioration. Critical care was time spent personally by me on the following activities: development of treatment plan with patient and/or surrogate as well as nursing, discussions with consultants, evaluation of patient's response to treatment, examination of patient, obtaining history from patient or surrogate, ordering and performing treatments and interventions, ordering and review of laboratory studies, ordering and review of radiographic studies, pulse oximetry and re-evaluation of patient's condition.   Final Clinical Impressions(s) / ED Diagnoses   Final diagnoses:  None  Patient with rapid A. fib. Patient will be admitted to stepdown on a diltiazem drip  The chart was scribed for me under my direct supervision.  I personally performed the history, physical, and medical decision making and all procedures  in the evaluation of this patient..   New Prescriptions New Prescriptions   No medications on file     Anita Ferguson, MD 05/22/16 1410

## 2016-05-22 NOTE — ED Notes (Signed)
Pt reports she has been having a productive cough with yellow and bld tinged sputum X4 days. States cough is mainly in the morning, denies fever, chills, CP. Reports it feels like a COPD exacerbation.

## 2016-05-22 NOTE — Consult Note (Signed)
Requesting provider: Verlee Monte, MD Consulting cardiologist: Satira Sark, MD  Reason for consultation: Atrial fibrillation  Clinical Summary Anita Weeks is a 65 y.o.female with past medical history outlined below, currently admitted to the hospital with a one-week history of progressive shortness of breath, leg edema and intermittent coughing. She states she was seen at an urgent care today and sent to the ER, documented to have rapid atrial fibrillation by ECG.  I reviewed her records and updated the chart, she is followed at [facility] by various providers with prior history of lung cancer, also recently seen by an orthopedist due to hip pain and right sided avascular necrosis with plan for surgery sometime in December. Cardiac history is somewhat unclear, there is mention that she had postoperative atrial fibrillation in the past. No definitive history of CAD or myocardial infarction, no history of cardiomyopathy. She has not been anticoagulated long-term.  CHADSVASC score is at least 3 based on available information. She does have a history of hypertension and takes Norvasc, Cozaar, and Lopressor at home. She has been placed on intravenous diltiazem per hospital team, transitioned from Lopressor to [place], and at the present time has been taken off of both Norvasc and Cozaar. She has also been placed on IV Lasix.  Echocardiogram is ordered and pending for assessment of cardiac structure and function. Her initial cardiac markers are normal.  No Known Allergies  Medications Scheduled Medications: . carvedilol  12.5 mg Oral BID WC  . furosemide  40 mg Intravenous BID  . heparin  5,000 Units Subcutaneous Q8H  . [START ON 05/23/2016] Influenza vac split quadrivalent PF  0.5 mL Intramuscular Tomorrow-1000  . [START ON 05/23/2016] magnesium oxide  200 mg Oral Daily  . [START ON 05/23/2016] multivitamin with minerals  1 tablet Oral Daily  . [START ON 05/23/2016] omega-3 acid ethyl esters   1 g Oral Daily  . pantoprazole  40 mg Oral Daily  . potassium chloride  40 mEq Oral Daily  . sodium chloride flush  3 mL Intravenous Q12H  . [START ON 05/23/2016] umeclidinium bromide  1 puff Inhalation Daily    Infusions: . sodium chloride 10 mL/hr at 05/22/16 1617  . diltiazem (CARDIZEM) infusion 10 mg/hr (05/22/16 1328)    PRN Medications: acetaminophen **OR** acetaminophen, HYDROcodone-acetaminophen, naphazoline-glycerin, ondansetron **OR** ondansetron (ZOFRAN) IV   Past Medical History:  Diagnosis Date  . Allergic rhinitis   . Anxiety   . COPD (chronic obstructive pulmonary disease) (Anita Weeks)   . Essential hypertension   . History of pulmonary embolus (PE)   . Hyperlipidemia   . Lung cancer (Anita Weeks)    Non-small cell lung cancer, left lower lobe - surgery 2013 ([facility])  . OSA (obstructive sleep apnea)   . Postoperative atrial fibrillation [facility])     Past Surgical History:  Procedure Laterality Date  . BREAST BIOPSY Left   . CESAREAN SECTION    . LOBECTOMY Left 2013   lower  . Reversal of tubal ligation      Family History  Problem Relation Age of Onset  . Heart disease Father   . Stroke Father   . Thyroid disease Sister     Social History [name] reports that she has quit smoking. She has never used smokeless tobacco. [name] reports that she does not drink alcohol.  Review of Systems Complete review of systems negative except as otherwise outlined in the clinical summary and also the following. No chest pain, no recent sense of palpitations. No  syncope.  Physical Examination Blood pressure 120/83, pulse 96, temperature 97.5 F (36.4 C), resp. rate (!) 25, height '5\' 7"'$  (1.702 m), weight 258 lb 6.1 oz (117.2 kg), SpO2 93 %.  Telemetry: Atrial fibrillation with heart rate in the 90-100 range.  Gen.: Obese woman in no acute distress. HEENT: Conjunctiva and lids normal, oropharynx clear. Neck: Supple, no elevated JVP or carotid bruits, no thyromegaly. Lungs:  Diminished breath sounds with a few crackles at the bases, nonlabored breathing at rest. Cardiac: Distant, irregularly irregular, no S3 or significant systolic murmur, no pericardial rub. Abdomen: Protuberant, nontender, bowel sounds present, no guarding or rebound. Extremities: 2+ lower leg edema, distal pulses 1-2+. Skin: Warm and dry. Musculoskeletal: No kyphosis. Neuropsychiatric: Alert and oriented x3, affect grossly appropriate.  Lab Results  Basic Metabolic Panel:  Recent Labs Lab 05/22/16 1149  NA 139  K 3.2*  CL 107  CO2 26  GLUCOSE 114*  BUN 16  CREATININE 1.12*  CALCIUM 8.7*    Liver Function Tests:  Recent Labs Lab 05/22/16 1205  AST 61*  ALT 57*  ALKPHOS 94  BILITOT 0.8  PROT 6.4*  ALBUMIN 3.2*    CBC:  Recent Labs Lab 05/22/16 1149  WBC 7.9  NEUTROABS 5.7  HGB 13.9  HCT 41.1  MCV 91.9  PLT 273    Cardiac Enzymes:  Recent Labs Lab 05/22/16 1205 05/22/16 1539  TROPONINI <0.03 <0.03    ECG I personally reviewed the tracing from today which shows atrial fibrillation at 155 bpm with nonspecific T-wave changes.  Imaging  Chest x-ray 05/22/2016: FINDINGS: Lungs are adequately inflated and demonstrate small amount of bilateral pleural fluid. There is patchy airspace opacification over the right middle lobe/ lingula possibly a pneumonia. Mild prominence of the perihilar markings. Mild cardiomegaly. Minimal calcified plaque over the aortic arch. There are degenerative changes of the spine.  IMPRESSION: Patchy midlung opacification suggesting a pneumonia. Small amount of bilateral pleural fluid.  Mild cardiomegaly with suggestion mild vascular congestion.  Impression  1. Presentation with rapid atrial fibrillation, onset uncertain but possibly within the last week in light of recent worsening shortness of breath, intermittent cough, and leg edema. Chest x-ray shows patchy midlung opacification suggesting possible infiltrate, but  she is not afebrile and does not have a leukocytosis. Vascular congestion also points towards volume overload and she may well have associated diastolic heart failure. CHADSVASC score is 3 based on available information. She may have a history of postoperative atrial fibrillation based on limited records.  2. Possible acute diastolic heart failure in the setting of atrial fibrillation. Echocardiogram is pending. She does not have any history of known cardiomyopathy.  3. History of non-small cell lung cancer status post left lower lobe resection at [facility] and reportedly stable over the last few years.  4. Obesity with obstructive sleep apnea.  5. Essential hypertension.  6. Elevated AST and ALT of uncertain significance at this point.  7. Avascular necrosis of the right hip, pending joint replacement at [facility] in December by report.  Recommendations  Discussed with patient and coworker in the room with permission. Agree with intravenous diltiazem for acute heart rate control. May need to further uptitrate depending on heart rate response, hopefully she will convert spontaneously. We did discuss initiation of anticoagulant for stroke prophylaxis and she is in agreement to proceed with this. We will plan to initiate Eliquis at this time. Echocardiogram pending for assessment of LVEF, this may also help to guide further medication adjustments and workup.  It looks like she will probably need a combination of beta blocker and calcium channel blocker for heart rate control, can probably stay on ARB for blood pressure management if needed as well. Whether or not long-term diuretic is needed has not yet been determined. Depending on her clinical course, can then determine whether short-term strategy of heart rate control and anticoagulation will be adequate (with possible outpatient cardioversion later), or whether she will need to have a cardioversion sooner.  Satira Sark, M.D., F.A.C.C.

## 2016-05-22 NOTE — ED Triage Notes (Signed)
Pt c/o SOB since last night. No relief from inhaler or nebulizer treatment. Pt also reports productive cough-yellow sputum.

## 2016-05-23 ENCOUNTER — Inpatient Hospital Stay (HOSPITAL_COMMUNITY): Payer: Medicare Other

## 2016-05-23 DIAGNOSIS — I509 Heart failure, unspecified: Secondary | ICD-10-CM

## 2016-05-23 LAB — BASIC METABOLIC PANEL
Anion gap: 8 (ref 5–15)
BUN: 14 mg/dL (ref 6–20)
CALCIUM: 8.7 mg/dL — AB (ref 8.9–10.3)
CO2: 28 mmol/L (ref 22–32)
CREATININE: 1.03 mg/dL — AB (ref 0.44–1.00)
Chloride: 106 mmol/L (ref 101–111)
GFR calc Af Amer: >60 mL/min (ref >60)
GFR calc non Af Amer: 55 mL/min — ABNORMAL LOW (ref >60)
GLUCOSE: 96 mg/dL (ref 65–99)
Potassium: 3.6 mmol/L (ref 3.5–5.1)
Sodium: 142 mmol/L (ref 135–145)

## 2016-05-23 LAB — MAGNESIUM: MAGNESIUM: 1.4 mg/dL — AB (ref 1.7–2.4)

## 2016-05-23 LAB — CBC
HCT: 36.1 % (ref 36.0–46.0)
HEMOGLOBIN: 12 g/dL (ref 12.0–15.0)
MCH: 30.7 pg (ref 26.0–34.0)
MCHC: 33.2 g/dL (ref 30.0–36.0)
MCV: 92.3 fL (ref 78.0–100.0)
PLATELETS: 222 10*3/uL (ref 150–400)
RBC: 3.91 MIL/uL (ref 3.87–5.11)
RDW: 14.6 % (ref 11.5–15.5)
WBC: 5.3 10*3/uL (ref 4.0–10.5)

## 2016-05-23 LAB — ECHOCARDIOGRAM COMPLETE
HEIGHTINCHES: 67 in
Weight: 4119.96 oz

## 2016-05-23 LAB — HEMOGLOBIN A1C
HEMOGLOBIN A1C: 5.9 % — AB (ref 4.8–5.6)
MEAN PLASMA GLUCOSE: 123 mg/dL

## 2016-05-23 LAB — MRSA PCR SCREENING: MRSA by PCR: NEGATIVE

## 2016-05-23 LAB — BRAIN NATRIURETIC PEPTIDE: B Natriuretic Peptide: 191 pg/mL — ABNORMAL HIGH (ref 0.0–100.0)

## 2016-05-23 MED ORDER — MAGNESIUM OXIDE 400 (241.3 MG) MG PO TABS
400.0000 mg | ORAL_TABLET | Freq: Every day | ORAL | Status: DC
  Administered 2016-05-24 – 2016-05-27 (×4): 400 mg via ORAL
  Filled 2016-05-23 (×4): qty 1

## 2016-05-23 MED ORDER — MAGNESIUM SULFATE 2 GM/50ML IV SOLN
2.0000 g | Freq: Once | INTRAVENOUS | Status: AC
  Administered 2016-05-23: 2 g via INTRAVENOUS
  Filled 2016-05-23: qty 50

## 2016-05-23 MED ORDER — DILTIAZEM HCL 60 MG PO TABS
60.0000 mg | ORAL_TABLET | Freq: Three times a day (TID) | ORAL | Status: DC
  Administered 2016-05-23 – 2016-05-25 (×6): 60 mg via ORAL
  Filled 2016-05-23 (×6): qty 1

## 2016-05-23 NOTE — Progress Notes (Signed)
*  PRELIMINARY RESULTS* Echocardiogram 2D Echocardiogram has been performed.  Leavy Cella 05/23/2016, 8:50 AM

## 2016-05-23 NOTE — Progress Notes (Signed)
Called report to [phone] RN and rolled patient to [phone] and left in care of staff.

## 2016-05-23 NOTE — Progress Notes (Addendum)
PROGRESS NOTE  Anita Weeks  NGE:[id number] DOB: 12-14-1950 DOA: 05/22/2016 PCP: No PCP Per Patient Outpatient Specialists:  Subjective: Feels a little bit better, still has shortness of breath while she talks. Denies any fever or chills  Brief Narrative:  Anita Weeks is a 65 y.o. female with medical history significant of LLL NSCLC with removal of 2013, has COPD, ex-smoker came to the hospital complaining about shortness of breath. Patient reported that she was having SOB for the past several days, seen in urgent care center several days ago, continues to have SOB and lower extremity edema so she came in today from work for further evaluation. She was found to be on A. fib with RVR.  Assessment & Plan:   Principal Problem:   Atrial fibrillation ([place]) Active Problems:   CHF (congestive heart failure) (H[facility])   Acute pulmonary edema ([facility])   History of lung cancer   Asthma   Essential hypertension   Atrial fibrillation -Presented with A. fib with RVR, heart rate was up to 125. -Patient started on Cardizem drip in the ED, admitted to stepdown for further management. -Normal TSH, 2-D echo pending. This patients CHA2DS2-VASc Score and unadjusted Ischemic Stroke Rate (% per year) is equal to 4.8 % stroke rate/year from a score of 4 1 point for each CHF, HTN, female gender and age between 61-74. Above score calculated as 1 point each if present [CHF, HTN, DM, Vascular=MI/PAD/Aortic Plaque, Age if 65-74, or Female] Above score calculated as 2 points each if present [Age > 75, or Stroke/TIA/TE]  -Started on Eliquis. -Heart rate is controlled, will discontinue Cardizem drip and start on oral Cardizem. Continue Coreg.  Acute congestive heart failure -New-onset bibasilar pulmonary edema, lower extremity edema consistent with acute CHF.  -Started on IV Lasix 40 mg twice a day, follow intake/output closely.  -Serial troponins showed no evidence of ACS. -Daily weight, follow renal function  closely. 2-D echo pending  Acute bibasilar pulmonary edema -This is likely secondary to acute CHF, patient started on diuresis. -Check 2-D echo.  Essential hypertension -Continue metoprolol, hold Cozaar because of concurrent diuresis. -Hold Norvasc, patient is on Cardizem drip.  History of lung cancer -Follow-up with Dr. Wynelle [facility]) as outpatient, no recent problems.  Hypomagnesemia -Patient is on oral supplements chronically, given IV magnesium oxide, check mag and a.m.  Hypokalemia -Improved with oral supplements  Renal insufficiency -Slightly elevated creatinine at 1.1 on admission, follow renal function closely.  DVT prophylaxis: Eliquis Code Status: Full Code Family Communication:  Disposition Plan:  Diet: Diet Heart Room service appropriate? Yes; Fluid consistency: Thin; Fluid restriction: 1500 mL Fluid  Consultants:   Cardiology  Procedures:   2-D echo, results pending  Antimicrobials:   None   Objective: Vitals:   05/23/16 0704 05/23/16 0800 05/23/16 0900 05/23/16 1000  BP:    109/66  Pulse: (!) 37 (!) 34 92 81  Resp: 17 15 (!) 24 (!) 27  Temp: 97.4 F (36.3 C)     TempSrc: Axillary     SpO2: 97% 96% 95% 95%  Weight:      Height:        Intake/Output Summary (Last 24 hours) at 05/23/16 1114 Last data filed at 05/23/16 1032  Gross per 24 hour  Intake           125.83 ml  Output             1420 ml  Net         -1294.17  ml   Filed Weights   05/22/16 1139 05/22/16 1526 05/23/16 0500  Weight: 115.7 kg (255 lb) 117.2 kg (258 lb 6.1 oz) 116.8 kg (257 lb 8 oz)    Examination: General exam: Appears calm and comfortable  Respiratory system: Clear to auscultation. Respiratory effort normal. Cardiovascular system: S1 & S2 heard, RRR. No JVD, murmurs, rubs, gallops or clicks. No pedal edema. Gastrointestinal system: Abdomen is nondistended, soft and nontender. No organomegaly or masses felt. Normal bowel sounds heard. Central nervous system:  Alert and oriented. No focal neurological deficits. Extremities: Symmetric 5 x 5 power.There is +2 lower extremity edema. Skin: No rashes, lesions or ulcers Psychiatry: Judgement and insight appear normal. Mood & affect appropriate.   Data Reviewed: I have personally reviewed following labs and imaging studies  CBC:  Recent Labs Lab 05/22/16 1149 05/23/16 0422  WBC 7.9 5.3  NEUTROABS 5.7  --   HGB 13.9 12.0  HCT 41.1 36.1  MCV 91.9 92.3  PLT 273 751   Basic Metabolic Panel:  Recent Labs Lab 05/22/16 1149 05/23/16 0422  NA 139 142  K 3.2* 3.6  CL 107 106  CO2 26 28  GLUCOSE 114* 96  BUN 16 14  CREATININE 1.12* 1.03*  CALCIUM 8.7* 8.7*  MG  --  1.4*   GFR: Estimated Creatinine Clearance: 70 mL/min (by C-G formula based on SCr of 1.03 mg/dL (H)). Liver Function Tests:  Recent Labs Lab 05/22/16 1205  AST 61*  ALT 57*  ALKPHOS 94  BILITOT 0.8  PROT 6.4*  ALBUMIN 3.2*   No results for input(s): LIPASE, AMYLASE in the last 168 hours. No results for input(s): AMMONIA in the last 168 hours. Coagulation Profile:  Recent Labs Lab 05/22/16 1539  INR 0.95   Cardiac Enzymes:  Recent Labs Lab 05/22/16 1205 05/22/16 1539 05/22/16 2132  TROPONINI <0.03 <0.03 <0.03   BNP (last 3 results) No results for input(s): PROBNP in the last 8760 hours. HbA1C:  Recent Labs  05/22/16 1539  HGBA1C 5.9*   CBG: No results for input(s): GLUCAP in the last 168 hours. Lipid Profile: No results for input(s): CHOL, HDL, LDLCALC, TRIG, CHOLHDL, LDLDIRECT in the last 72 hours. Thyroid Function Tests:  Recent Labs  05/22/16 1539  TSH 0.955   Anemia Panel: No results for input(s): VITAMINB12, FOLATE, FERRITIN, TIBC, IRON, RETICCTPCT in the last 72 hours. Urine analysis: No results found for: COLORURINE, APPEARANCEUR, LABSPEC, PHURINE, GLUCOSEU, HGBUR, BILIRUBINUR, KETONESUR, PROTEINUR, UROBILINOGEN, NITRITE, LEUKOCYTESUR Sepsis  Labs: '@LABRCNTIP'$ (procalcitonin:4,lacticidven:4)  )No results found for this or any previous visit (from the past 240 hour(s)).   Invalid input(s): PROCALCITONIN, LACTICACIDVEN   Radiology Studies: Dg Chest 2 View  Result Date: 05/22/2016 CLINICAL DATA:  Shortness of breath since last night with productive cough. EXAM: CHEST  2 VIEW COMPARISON:  None. FINDINGS: Lungs are adequately inflated and demonstrate small amount of bilateral pleural fluid. There is patchy airspace opacification over the right middle lobe/ lingula possibly a pneumonia. Mild prominence of the perihilar markings. Mild cardiomegaly. Minimal calcified plaque over the aortic arch. There are degenerative changes of the spine. IMPRESSION: Patchy midlung opacification suggesting a pneumonia. Small amount of bilateral pleural fluid. Mild cardiomegaly with suggestion mild vascular congestion. Electronically Signed   By: Marin Olp M.D.   On: 05/22/2016 12:16        Scheduled Meds: . apixaban  5 mg Oral BID  . carvedilol  12.5 mg Oral BID WC  . furosemide  40 mg Intravenous BID  .  magnesium oxide  200 mg Oral Daily  . multivitamin with minerals  1 tablet Oral Daily  . omega-3 acid ethyl esters  1 g Oral Daily  . pantoprazole  40 mg Oral Daily  . potassium chloride  40 mEq Oral Daily  . sodium chloride flush  3 mL Intravenous Q12H  . umeclidinium bromide  1 puff Inhalation Daily   Continuous Infusions: . diltiazem (CARDIZEM) infusion 10 mg/hr (05/23/16 0525)     LOS: 1 day    Time spent: 35 minutes    Ilisha Blust A, MD [facility] Pager [phone]  If 7PM-7AM, please contact night-coverage [email or web address] Password [email or web address] 05/23/2016, 11:14 AM

## 2016-05-24 LAB — CBC
HCT: 36.7 % (ref 36.0–46.0)
HEMOGLOBIN: 12.1 g/dL (ref 12.0–15.0)
MCH: 30.6 pg (ref 26.0–34.0)
MCHC: 33 g/dL (ref 30.0–36.0)
MCV: 92.7 fL (ref 78.0–100.0)
Platelets: 237 10*3/uL (ref 150–400)
RBC: 3.96 MIL/uL (ref 3.87–5.11)
RDW: 14.7 % (ref 11.5–15.5)
WBC: 5.1 10*3/uL (ref 4.0–10.5)

## 2016-05-24 LAB — BASIC METABOLIC PANEL
ANION GAP: 8 (ref 5–15)
BUN: 16 mg/dL (ref 6–20)
CALCIUM: 8.5 mg/dL — AB (ref 8.9–10.3)
CO2: 26 mmol/L (ref 22–32)
Chloride: 104 mmol/L (ref 101–111)
Creatinine, Ser: 0.88 mg/dL (ref 0.44–1.00)
GFR calc Af Amer: >60 mL/min (ref >60)
GFR calc non Af Amer: >60 mL/min (ref >60)
GLUCOSE: 89 mg/dL (ref 65–99)
Potassium: 3.7 mmol/L (ref 3.5–5.1)
Sodium: 138 mmol/L (ref 135–145)

## 2016-05-24 LAB — MAGNESIUM: MAGNESIUM: 1.7 mg/dL (ref 1.7–2.4)

## 2016-05-24 MED ORDER — ALBUTEROL SULFATE (2.5 MG/3ML) 0.083% IN NEBU: 2.5000 mg | INHALATION_SOLUTION | RESPIRATORY_TRACT | Status: DC | PRN

## 2016-05-24 MED ORDER — POTASSIUM CHLORIDE CRYS ER 20 MEQ PO TBCR
40.0000 meq | EXTENDED_RELEASE_TABLET | Freq: Two times a day (BID) | ORAL | Status: DC
  Administered 2016-05-24 – 2016-05-27 (×6): 40 meq via ORAL
  Filled 2016-05-24 (×6): qty 2

## 2016-05-24 MED ORDER — FUROSEMIDE 10 MG/ML IJ SOLN
60.0000 mg | Freq: Three times a day (TID) | INTRAMUSCULAR | Status: AC
  Administered 2016-05-24 – 2016-05-26 (×8): 60 mg via INTRAVENOUS
  Filled 2016-05-24 (×8): qty 6

## 2016-05-24 MED ORDER — MAGNESIUM SULFATE 2 GM/50ML IV SOLN
2.0000 g | Freq: Once | INTRAVENOUS | Status: AC
  Administered 2016-05-24: 2 g via INTRAVENOUS
  Filled 2016-05-24: qty 50

## 2016-05-24 NOTE — Progress Notes (Addendum)
PROGRESS NOTE  Anita Weeks  TOI:[id number] DOB: Sep 26, 1950 DOA: 05/22/2016 PCP: No PCP Per Patient Outpatient Specialists:  Subjective: Was disappointed this morning that she still have very significant leg edema. Reporting that she is not urinating that much.  Brief Narrative:  Anita Weeks is a 65 y.o. female with medical history significant of LLL NSCLC with removal of 2013, has COPD, ex-smoker came to the hospital complaining about shortness of breath. Patient reported that she was having SOB for the past several days, seen in urgent care center several days ago, continues to have SOB and lower extremity edema so she came in today from work for further evaluation. She was found to be on A. fib with RVR.  Assessment & Plan:   Principal Problem:   Atrial fibrillation ([place]) Active Problems:   CHF (congestive heart failure) (HCC)   Acute pulmonary edema (HCC)   History of lung cancer   Asthma   Essential hypertension   Atrial fibrillation -Presented with A. fib with RVR, heart rate was up to 125. Patient has postoperative A. fib in 2013. -Initially was on Cardizem drip, this is switched to oral Cardizem. -Normal TSH, 2-D echo pending. This patients CHA2DS2-VASc Score and unadjusted Ischemic Stroke Rate (% per year) is equal to 4.8 % stroke rate/year from a score of 4 1 point for each CHF, HTN, female gender and age between 45-74. Above score calculated as 1 point each if present [CHF, HTN, DM, Vascular=MI/PAD/Aortic Plaque, Age if 65-74, or Female] Above score calculated as 2 points each if present [Age > 75, or Stroke/TIA/TE]  -Started on Eliquis. -Cardizem drip discontinued, on oral Cardizem and Coreg.  Acute congestive heart failure -New-onset bibasilar pulmonary edema, lower extremity edema consistent with acute CHF.  -2-D echo showed EF of 40-45% with diffuse hypokinesis,? Tachycardiomyopathy, HTN or less likely IHD, cardiology on board. -Serial troponins showed no  evidence of ACS. -Daily weight, follow renal function closely. -Was on 40 mg of Lasix twice a day, switched to 60 mg every 8 hours.  Acute bibasilar pulmonary edema -This is likely secondary to acute CHF, patient started on diuresis. -Check 2-D echo.  Essential hypertension -Continue metoprolol, hold Cozaar because of concurrent diuresis. -Hold Norvasc, patient is on Cardizem drip.  History of lung cancer -Follow-up with Dr. Wynelle [facility]) as outpatient, no recent problems.  Hypomagnesemia -Patient is on oral supplements chronically, given IV magnesium oxide, check mag and a.m.  Hypokalemia -Improved with oral supplements  Renal insufficiency -Slightly elevated creatinine at 1.1 on admission, follow renal function closely.   DVT prophylaxis: Eliquis Code Status: Full Code Family Communication:  Disposition Plan:  Diet: Diet Heart Room service appropriate? Yes; Fluid consistency: Thin; Fluid restriction: 1500 mL Fluid  Consultants:   Cardiology  Procedures:   2-D echo, results pending  Antimicrobials:   None   Objective: Vitals:   05/23/16 1634 05/23/16 2122 05/24/16 0525 05/24/16 0957  BP:  126/84 130/85   Pulse: 90 90 86   Resp: (!) '22 20 20   '$ Temp: 97.6 F (36.4 C) 98.3 F (36.8 C) 97.7 F (36.5 C)   TempSrc: Axillary Oral Oral   SpO2: 95% 100% 95% 96%  Weight:   117 kg (257 lb 14.4 oz)   Height:        Intake/Output Summary (Last 24 hours) at 05/24/16 1040 Last data filed at 05/24/16 0900  Gross per 24 hour  Intake  530 ml  Output                0 ml  Net              530 ml   Filed Weights   05/22/16 1526 05/23/16 0500 05/24/16 0525  Weight: 117.2 kg (258 lb 6.1 oz) 116.8 kg (257 lb 8 oz) 117 kg (257 lb 14.4 oz)    Examination: General exam: Appears calm and comfortable  Respiratory system: Clear to auscultation. Respiratory effort normal. Cardiovascular system: S1 & S2 heard, RRR. No JVD, murmurs, rubs, gallops or clicks.  No pedal edema. Gastrointestinal system: Abdomen is nondistended, soft and nontender. No organomegaly or masses felt. Normal bowel sounds heard. Central nervous system: Alert and oriented. No focal neurological deficits. Extremities: Symmetric 5 x 5 power.There is +2 lower extremity edema. Skin: No rashes, lesions or ulcers Psychiatry: Judgement and insight appear normal. Mood & affect appropriate.   Data Reviewed: I have personally reviewed following labs and imaging studies  CBC:  Recent Labs Lab 05/22/16 1149 05/23/16 0422 05/24/16 0545  WBC 7.9 5.3 5.1  NEUTROABS 5.7  --   --   HGB 13.9 12.0 12.1  HCT 41.1 36.1 36.7  MCV 91.9 92.3 92.7  PLT 273 222 818   Basic Metabolic Panel:  Recent Labs Lab 05/22/16 1149 05/23/16 0422 05/24/16 0545  NA 139 142 138  K 3.2* 3.6 3.7  CL 107 106 104  CO2 '26 28 26  '$ GLUCOSE 114* 96 89  BUN '16 14 16  '$ CREATININE 1.12* 1.03* 0.88  CALCIUM 8.7* 8.7* 8.5*  MG  --  1.4* 1.7   GFR: Estimated Creatinine Clearance: 82.1 mL/min (by C-G formula based on SCr of 0.88 mg/dL). Liver Function Tests:  Recent Labs Lab 05/22/16 1205  AST 61*  ALT 57*  ALKPHOS 94  BILITOT 0.8  PROT 6.4*  ALBUMIN 3.2*   No results for input(s): LIPASE, AMYLASE in the last 168 hours. No results for input(s): AMMONIA in the last 168 hours. Coagulation Profile:  Recent Labs Lab 05/22/16 1539  INR 0.95   Cardiac Enzymes:  Recent Labs Lab 05/22/16 1205 05/22/16 1539 05/22/16 2132  TROPONINI <0.03 <0.03 <0.03   BNP (last 3 results) No results for input(s): PROBNP in the last 8760 hours. HbA1C:  Recent Labs  05/22/16 1539  HGBA1C 5.9*   CBG: No results for input(s): GLUCAP in the last 168 hours. Lipid Profile: No results for input(s): CHOL, HDL, LDLCALC, TRIG, CHOLHDL, LDLDIRECT in the last 72 hours. Thyroid Function Tests:  Recent Labs  05/22/16 1539  TSH 0.955   Anemia Panel: No results for input(s): VITAMINB12, FOLATE, FERRITIN,  TIBC, IRON, RETICCTPCT in the last 72 hours. Urine analysis: No results found for: COLORURINE, APPEARANCEUR, LABSPEC, [place], GLUCOSEU, HGBUR, BILIRUBINUR, [place], [place], UROBILINOGEN, NITRITE, LEUKOCYTESUR Sepsis Labs: '@LABRCNTIP'$ (procalcitonin:4,lacticidven:4)  ) Recent Results (from the past 240 hour(s))  MRSA PCR Screening     Status: None   Collection Time: 05/23/16 11:20 AM  Result Value Ref Range Status   MRSA by PCR NEGATIVE NEGATIVE Final    Comment:        The GeneXpert MRSA Assay (FDA approved for NASAL specimens only), is one component of a comprehensive MRSA colonization surveillance program. It is not intended to diagnose MRSA infection nor to guide or monitor treatment for MRSA infections.      Invalid input(s): PROCALCITONIN, LACTICACIDVEN   Radiology Studies: Dg Chest 2 View  Result Date: 05/22/2016 CLINICAL DATA:  Shortness of breath since last night with productive cough. EXAM: CHEST  2 VIEW COMPARISON:  None. FINDINGS: Lungs are adequately inflated and demonstrate small amount of bilateral pleural fluid. There is patchy airspace opacification over the right middle lobe/ lingula possibly a pneumonia. Mild prominence of the perihilar markings. Mild cardiomegaly. Minimal calcified plaque over the aortic arch. There are degenerative changes of the spine. IMPRESSION: Patchy midlung opacification suggesting a pneumonia. Small amount of bilateral pleural fluid. Mild cardiomegaly with suggestion mild vascular congestion. Electronically Signed   By: Marin Olp M.D.   On: 05/22/2016 12:16        Scheduled Meds: . apixaban  5 mg Oral BID  . carvedilol  12.5 mg Oral BID WC  . diltiazem  60 mg Oral Q8H  . furosemide  60 mg Intravenous Q8H  . magnesium oxide  400 mg Oral Daily  . magnesium sulfate 1 - 4 g bolus IVPB  2 g Intravenous Once  . multivitamin with minerals  1 tablet Oral Daily  . omega-3 acid ethyl esters  1 g Oral Daily  . pantoprazole  40  mg Oral Daily  . potassium chloride  40 mEq Oral BID  . sodium chloride flush  3 mL Intravenous Q12H  . umeclidinium bromide  1 puff Inhalation Daily   Continuous Infusions:     LOS: 2 days    Time spent: 35 minutes    Florentine Diekman A, MD [facility] Pager [phone]  If 7PM-7AM, please contact night-coverage [email or web address] Password [email or web address] 05/24/2016, 10:40 AM

## 2016-05-25 DIAGNOSIS — J810 Acute pulmonary edema: Secondary | ICD-10-CM

## 2016-05-25 LAB — BASIC METABOLIC PANEL
ANION GAP: 9 (ref 5–15)
BUN: 20 mg/dL (ref 6–20)
CHLORIDE: 101 mmol/L (ref 101–111)
CO2: 28 mmol/L (ref 22–32)
Calcium: 8.6 mg/dL — ABNORMAL LOW (ref 8.9–10.3)
Creatinine, Ser: 1.03 mg/dL — ABNORMAL HIGH (ref 0.44–1.00)
GFR calc Af Amer: >60 mL/min (ref >60)
GFR calc non Af Amer: 55 mL/min — ABNORMAL LOW (ref >60)
GLUCOSE: 84 mg/dL (ref 65–99)
POTASSIUM: 3.7 mmol/L (ref 3.5–5.1)
Sodium: 138 mmol/L (ref 135–145)

## 2016-05-25 LAB — CBC
HEMATOCRIT: 38.4 % (ref 36.0–46.0)
HEMOGLOBIN: 12.7 g/dL (ref 12.0–15.0)
MCH: 30.8 pg (ref 26.0–34.0)
MCHC: 33.1 g/dL (ref 30.0–36.0)
MCV: 93 fL (ref 78.0–100.0)
Platelets: 250 10*3/uL (ref 150–400)
RBC: 4.13 MIL/uL (ref 3.87–5.11)
RDW: 14.7 % (ref 11.5–15.5)
WBC: 4.6 10*3/uL (ref 4.0–10.5)

## 2016-05-25 MED ORDER — CARVEDILOL 12.5 MG PO TABS
12.5000 mg | ORAL_TABLET | Freq: Once | ORAL | Status: DC
  Filled 2016-05-25: qty 1

## 2016-05-25 MED ORDER — CARVEDILOL 12.5 MG PO TABS
25.0000 mg | ORAL_TABLET | Freq: Two times a day (BID) | ORAL | Status: DC
  Administered 2016-05-25 – 2016-05-26 (×2): 25 mg via ORAL
  Filled 2016-05-25 (×2): qty 2

## 2016-05-25 NOTE — Progress Notes (Signed)
Subjective:    SOB improving but not resolved.   Objective:   Temp:  [97.5 F (36.4 C)-97.8 F (36.6 C)] 97.8 F (36.6 C) (10/30 0812) Pulse Rate:  [70-108] 91 (10/30 0812) Resp:  [18-22] 18 (10/30 0812) BP: (118-132)/(74-86) 118/86 (10/30 0812) SpO2:  [90 %-99 %] 98 % (10/30 0812) Weight:  [251 lb 14.4 oz (114.3 kg)] 251 lb 14.4 oz (114.3 kg) (10/30 0607)    Filed Weights   05/23/16 0500 05/24/16 0525 05/25/16 9562  Weight: 257 lb 8 oz (116.8 kg) 257 lb 14.4 oz (117 kg) 251 lb 14.4 oz (114.3 kg)    Intake/Output Summary (Last 24 hours) at 05/25/16 0947 Last data filed at 05/25/16 0830  Gross per 24 hour  Intake              530 ml  Output             2475 ml  Net            -1945 ml    Telemetry: afib, variable rates with some RVR  Exam:  General: NAD  HEENT: sclera clear, throat clear  Resp: mild crackles bilateral bases  Cardiac: irreg, regular rate, mildly elevated JVD  GI: abdomen soft, NT, ND  MSK: no LE edema  Neuro: no focal deficits  Psych: appropriate affect  Lab Results:  Basic Metabolic Panel:  Recent Labs Lab 05/23/16 0422 05/24/16 0545 05/25/16 0555  NA 142 138 138  K 3.6 3.7 3.7  CL 106 104 101  CO2 '28 26 28  '$ GLUCOSE 96 89 84  BUN '14 16 20  '$ CREATININE 1.03* 0.88 1.03*  CALCIUM 8.7* 8.5* 8.6*  MG 1.4* 1.7  --     Liver Function Tests:  Recent Labs Lab 05/22/16 1205  AST 61*  ALT 57*  ALKPHOS 94  BILITOT 0.8  PROT 6.4*  ALBUMIN 3.2*    CBC:  Recent Labs Lab 05/23/16 0422 05/24/16 0545 05/25/16 0555  WBC 5.3 5.1 4.6  HGB 12.0 12.1 12.7  HCT 36.1 36.7 38.4  MCV 92.3 92.7 93.0  PLT 222 237 250    Cardiac Enzymes:  Recent Labs Lab 05/22/16 1205 05/22/16 1539 05/22/16 2132  TROPONINI <0.03 <0.03 <0.03    BNP: No results for input(s): PROBNP in the last 8760 hours.  Coagulation:  Recent Labs Lab 05/22/16 1539  INR 0.95    ECG:   Medications:   Scheduled Medications: . apixaban  5  mg Oral BID  . carvedilol  12.5 mg Oral BID WC  . diltiazem  60 mg Oral Q8H  . furosemide  60 mg Intravenous Q8H  . magnesium oxide  400 mg Oral Daily  . multivitamin with minerals  1 tablet Oral Daily  . omega-3 acid ethyl esters  1 g Oral Daily  . pantoprazole  40 mg Oral Daily  . potassium chloride  40 mEq Oral BID  . sodium chloride flush  3 mL Intravenous Q12H  . umeclidinium bromide  1 puff Inhalation Daily     Infusions:     PRN Medications:  acetaminophen **OR** acetaminophen, albuterol, HYDROcodone-acetaminophen, naphazoline-glycerin, ondansetron **OR** ondansetron (ZOFRAN) IV     Assessment/Plan    1. Afib - new diagnosis this admit, though apparently had an isolated postoperative episode in 2013. Rate control with coreg 12.'5mg'$  bid, dilt '60mg'$  q8 hrs. Started on eliquis for stroke prevention.  - echo LVEF 40-45%, elevated LA pressure, mild MR, PASP 45. - will increase coreg,  stop dilt in setting of systolic dysfunction. Still with elevated rates.   2. Acute heart failure - evidence of systolic and diastolic dyfsunction by echo, presented in afib with RVR likely played a role - he is on lasix '60mg'$  IV q 8 hrs,  Negative 1.3 liters yesterday, negative 2.7 liters since admission. Renal function shows overall stable range. - will increase coreg, decrease dilt in setting of systolic dysfunction. Still with elevated rates.  - continue IV lasix today, possibly convert to oral tomorrow - would pursue rate control and medical therapy, defer ischemic testing at this time.      Carlyle Dolly, M.D.

## 2016-05-25 NOTE — Progress Notes (Signed)
PROGRESS NOTE  Anita Weeks  NOB:[id number] DOB: 20-Jul-1951 DOA: 05/22/2016 PCP: No PCP Per Patient Outpatient Specialists:  Subjective: Urine output increased since yesterday as Lasix increased. Feels much better since yesterday, still has residual SOB.  Brief Narrative:  Anita Weeks is a 65 y.o. female with medical history significant of LLL NSCLC with removal of 2013, has COPD, ex-smoker came to the hospital complaining about shortness of breath. Patient reported that she was having SOB for the past several days, seen in urgent care center several days ago, continues to have SOB and lower extremity edema so she came in today from work for further evaluation. She was found to be on A. fib with RVR.  Assessment & Plan:   Principal Problem:   Atrial fibrillation ([place]) Active Problems:   CHF (congestive heart failure) (HCC)   Acute pulmonary edema (HCC)   History of lung cancer   Asthma   Essential hypertension   Atrial fibrillation -Presented with A. fib with RVR, heart rate was up to 125. Patient has postoperative A. fib in 2013. -Initially was on Cardizem drip, this is switched to oral Cardizem. -Normal TSH, 2-D echo showed LVEF of 40-45% This patients CHA2DS2-VASc Score and unadjusted Ischemic Stroke Rate (% per year) is equal to 4.8 % stroke rate/year from a score of 4 1 point for each CHF, HTN, female gender and age between 67-74. Above score calculated as 1 point each if present [CHF, HTN, DM, Vascular=MI/PAD/Aortic Plaque, Age if 65-74, or Female] Above score calculated as 2 points each if present [Age > 75, or Stroke/TIA/TE]  -Started on Eliquis. -Cardiology increased Coreg and decreased Cardizem.  Acute congestive heart failure -New-onset bibasilar pulmonary edema, lower extremity edema consistent with acute CHF.  -2-D echo showed EF of 40-45% with diffuse hypokinesis,? Tachycardiomyopathy, HTN or less likely IHD, cardiology on board. -Serial troponins showed no  evidence of ACS. No ischemic testing planned for this admission. -Daily weight, follow renal function closely. Keep leg elevated and restrict fluid and salt intake. -Currently on Lasix 60 mg IV every 8 hours since 10/29, follow renal function, weight down by 6 pounds since yesterday.  Acute bibasilar pulmonary edema -This is likely secondary to acute CHF, patient started on diuresis.  Essential hypertension -Continue metoprolol, hold Cozaar because of concurrent diuresis. -Hold Norvasc, patient is on Cardizem drip.  History of lung cancer -Follow-up with Dr. Wynelle Cleveland [facility]) as outpatient, no recent problems.  Hypomagnesemia -Patient is on oral supplements chronically, given IV magnesium oxide, check mag and a.m.  Hypokalemia -Improved with oral supplements.  Renal insufficiency -Slightly elevated creatinine at 1.1 on admission, follow renal function closely. Overall stable.   DVT prophylaxis: Eliquis Code Status: Full Code Family Communication:  Disposition Plan:  Diet: Diet Heart Room service appropriate? Yes; Fluid consistency: Thin; Fluid restriction: 1500 mL Fluid  Consultants:   Cardiology  Procedures:   2-D echo, results pending  Antimicrobials:   None   Objective: Vitals:   05/24/16 2111 05/25/16 0607 05/25/16 0753 05/25/16 0812  BP: 128/82 132/83  118/86  Pulse: 90 70  91  Resp: '20 20  18  '$ Temp: 97.5 F (36.4 C) 97.6 F (36.4 C)  97.8 F (36.6 C)  TempSrc: Oral Oral  Oral  SpO2: 99% 96% 90% 98%  Weight:  114.3 kg (251 lb 14.4 oz)    Height:        Intake/Output Summary (Last 24 hours) at 05/25/16 1051 Last data filed at 05/25/16 0830  Gross per  24 hour  Intake              530 ml  Output             2725 ml  Net            -2195 ml   Filed Weights   05/23/16 0500 05/24/16 0525 05/25/16 0607  Weight: 116.8 kg (257 lb 8 oz) 117 kg (257 lb 14.4 oz) 114.3 kg (251 lb 14.4 oz)    Examination: General exam: Appears calm and comfortable    Respiratory system: Clear to auscultation. Respiratory effort normal. Cardiovascular system: S1 & S2 heard, RRR. No JVD, murmurs, rubs, gallops or clicks. No pedal edema. Gastrointestinal system: Abdomen is nondistended, soft and nontender. No organomegaly or masses felt. Normal bowel sounds heard. Central nervous system: Alert and oriented. No focal neurological deficits. Extremities: Symmetric 5 x 5 power.There is +2 lower extremity edema. Skin: No rashes, lesions or ulcers Psychiatry: Judgement and insight appear normal. Mood & affect appropriate.   Data Reviewed: I have personally reviewed following labs and imaging studies  CBC:  Recent Labs Lab 05/22/16 1149 05/23/16 0422 05/24/16 0545 05/25/16 0555  WBC 7.9 5.3 5.1 4.6  NEUTROABS 5.7  --   --   --   HGB 13.9 12.0 12.1 12.7  HCT 41.1 36.1 36.7 38.4  MCV 91.9 92.3 92.7 93.0  PLT 273 222 237 203   Basic Metabolic Panel:  Recent Labs Lab 05/22/16 1149 05/23/16 0422 05/24/16 0545 05/25/16 0555  NA 139 142 138 138  K 3.2* 3.6 3.7 3.7  CL 107 106 104 101  CO2 '26 28 26 28  '$ GLUCOSE 114* 96 89 84  BUN '16 14 16 20  '$ CREATININE 1.12* 1.03* 0.88 1.03*  CALCIUM 8.7* 8.7* 8.5* 8.6*  MG  --  1.4* 1.7  --    GFR: Estimated Creatinine Clearance: 69.2 mL/min (by C-G formula based on SCr of 1.03 mg/dL (H)). Liver Function Tests:  Recent Labs Lab 05/22/16 1205  AST 61*  ALT 57*  ALKPHOS 94  BILITOT 0.8  PROT 6.4*  ALBUMIN 3.2*   No results for input(s): LIPASE, AMYLASE in the last 168 hours. No results for input(s): AMMONIA in the last 168 hours. Coagulation Profile:  Recent Labs Lab 05/22/16 1539  INR 0.95   Cardiac Enzymes:  Recent Labs Lab 05/22/16 1205 05/22/16 1539 05/22/16 2132  TROPONINI <0.03 <0.03 <0.03   BNP (last 3 results) No results for input(s): PROBNP in the last 8760 hours. HbA1C:  Recent Labs  05/22/16 1539  HGBA1C 5.9*   CBG: No results for input(s): GLUCAP in the last 168  hours. Lipid Profile: No results for input(s): CHOL, HDL, LDLCALC, TRIG, CHOLHDL, LDLDIRECT in the last 72 hours. Thyroid Function Tests:  Recent Labs  05/22/16 1539  TSH 0.955   Anemia Panel: No results for input(s): VITAMINB12, FOLATE, FERRITIN, TIBC, IRON, RETICCTPCT in the last 72 hours. Urine analysis: No results found for: COLORURINE, APPEARANCEUR, LABSPEC, [place], GLUCOSEU, HGBUR, BILIRUBINUR, [place], [place], UROBILINOGEN, NITRITE, LEUKOCYTESUR Sepsis Labs: '@LABRCNTIP'$ (procalcitonin:4,lacticidven:4)  ) Recent Results (from the past 240 hour(s))  MRSA PCR Screening     Status: None   Collection Time: 05/23/16 11:20 AM  Result Value Ref Range Status   MRSA by PCR NEGATIVE NEGATIVE Final    Comment:        The GeneXpert MRSA Assay (FDA approved for NASAL specimens only), is one component of a comprehensive MRSA colonization surveillance program. It is not intended  to diagnose MRSA infection nor to guide or monitor treatment for MRSA infections.      Invalid input(s): PROCALCITONIN, [place]   Radiology Studies: No results found.      Scheduled Meds: . apixaban  5 mg Oral BID  . carvedilol  12.5 mg Oral Once  . carvedilol  25 mg Oral BID WC  . furosemide  60 mg Intravenous Q8H  . magnesium oxide  400 mg Oral Daily  . multivitamin with minerals  1 tablet Oral Daily  . omega-3 acid ethyl esters  1 g Oral Daily  . pantoprazole  40 mg Oral Daily  . potassium chloride  40 mEq Oral BID  . sodium chloride flush  3 mL Intravenous Q12H  . umeclidinium bromide  1 puff Inhalation Daily   Continuous Infusions:     LOS: 3 days    Time spent: 35 minutes    Deleah Tison A, MD [facility] Pager [phone]  If 7PM-7AM, please contact night-coverage [email or web address] Password [email or web address] 05/25/2016, 10:51 AM

## 2016-05-25 NOTE — Care Management Note (Signed)
Case Management Note  Patient Details  Name: Anita Weeks MRN: [id number] Date of Birth: January 28, 1951  Subjective/Objective:                  Pt admitted with new onset a-fib. She is from home, lives alone. She works full time. She has PCP in [place] [facility] [place] but is looking to change to one in [place]. Pt provided with list of PCP in [place] accepting new pts. Pt has no difficulty affording medications. She has no difficulty getting to appointments. She wears CPAP at night but does not have supplemental oxygen PTA and she will need to be weaned from oxygen prior to DC. She plans to return home with self care. She will be started on Eliquis and was given voucher for 30-days free.   Action/Plan: No further CM needs anticipated.   Expected Discharge Date:    05/26/2016              Expected Discharge Plan:  Home/Self Care  In-House Referral:  NA  Discharge planning Services  CM Consult  Post Acute Care Choice:  NA Choice offered to:  NA  Status of Service:  Completed, signed off  Sherald Barge, RN 05/25/2016, 2:24 PM

## 2016-05-25 NOTE — Care Management Important Message (Signed)
Important Message  Patient Details  Name: [name] MRN: [id number] Date of Birth: [birth year]   Medicare Important Message Given:  Yes    Sherald Barge, RN 05/25/2016, 2:23 PM

## 2016-05-26 DIAGNOSIS — I5041 Acute combined systolic (congestive) and diastolic (congestive) heart failure: Secondary | ICD-10-CM

## 2016-05-26 LAB — BASIC METABOLIC PANEL
Anion gap: 9 (ref 5–15)
BUN: 25 mg/dL — AB (ref 6–20)
CHLORIDE: 100 mmol/L — AB (ref 101–111)
CO2: 28 mmol/L (ref 22–32)
CREATININE: 0.98 mg/dL (ref 0.44–1.00)
Calcium: 8.9 mg/dL (ref 8.9–10.3)
GFR calc Af Amer: >60 mL/min (ref >60)
GFR calc non Af Amer: 58 mL/min — ABNORMAL LOW (ref >60)
GLUCOSE: 91 mg/dL (ref 65–99)
Potassium: 3.9 mmol/L (ref 3.5–5.1)
Sodium: 137 mmol/L (ref 135–145)

## 2016-05-26 LAB — LIPID PANEL
CHOL/HDL RATIO: 6.8 ratio
Cholesterol: 272 mg/dL — ABNORMAL HIGH (ref 0–200)
HDL: 40 mg/dL — AB (ref >40)
LDL Cholesterol: 184 mg/dL — ABNORMAL HIGH (ref 0–99)
Triglycerides: 242 mg/dL — ABNORMAL HIGH (ref <150)
VLDL: 48 mg/dL — AB (ref 0–40)

## 2016-05-26 LAB — MAGNESIUM: Magnesium: 1.7 mg/dL (ref 1.7–2.4)

## 2016-05-26 MED ORDER — METOLAZONE 5 MG PO TABS
2.5000 mg | ORAL_TABLET | Freq: Once | ORAL | Status: AC
  Administered 2016-05-26: 2.5 mg via ORAL
  Filled 2016-05-26: qty 1

## 2016-05-26 MED ORDER — LEVALBUTEROL HCL 0.63 MG/3ML IN NEBU: 0.6300 mg | INHALATION_SOLUTION | Freq: Three times a day (TID) | RESPIRATORY_TRACT | Status: DC | PRN

## 2016-05-26 MED ORDER — CARVEDILOL 12.5 MG PO TABS
12.5000 mg | ORAL_TABLET | Freq: Once | ORAL | Status: AC
  Administered 2016-05-26: 12.5 mg via ORAL

## 2016-05-26 MED ORDER — MAGNESIUM SULFATE 2 GM/50ML IV SOLN
2.0000 g | Freq: Once | INTRAVENOUS | Status: AC
  Administered 2016-05-26: 2 g via INTRAVENOUS
  Filled 2016-05-26: qty 50

## 2016-05-26 MED ORDER — CARVEDILOL 12.5 MG PO TABS
37.5000 mg | ORAL_TABLET | Freq: Two times a day (BID) | ORAL | Status: DC
  Administered 2016-05-26 – 2016-05-27 (×2): 37.5 mg via ORAL
  Filled 2016-05-26 (×2): qty 3

## 2016-05-26 MED ORDER — LOSARTAN POTASSIUM 25 MG PO TABS: 12.5000 mg | ORAL_TABLET | Freq: Every day | ORAL | Status: DC

## 2016-05-26 NOTE — Progress Notes (Signed)
Subjective:    SOB improving but not resolved  Objective:   Temp:  [98.2 F (36.8 C)-98.7 F (37.1 C)] 98.6 F (37 C) (10/31 0500) Pulse Rate:  [71-90] 71 (10/31 0500) Resp:  [18] 18 (10/31 0500) BP: (113-136)/(64-91) 113/64 (10/31 0500) SpO2:  [93 %-99 %] 99 % (10/31 0752) Weight:  [251 lb 15.8 oz (114.3 kg)] 251 lb 15.8 oz (114.3 kg) (10/31 0500) Last BM Date: 05/25/16  Filed Weights   05/24/16 0525 05/25/16 0607 05/26/16 0500  Weight: 257 lb 14.4 oz (117 kg) 251 lb 14.4 oz (114.3 kg) 251 lb 15.8 oz (114.3 kg)    Intake/Output Summary (Last 24 hours) at 05/26/16 0919 Last data filed at 05/26/16 0856  Gross per 24 hour  Intake              601 ml  Output             2500 ml  Net            -1899 ml    Telemetry:afib with RVR  Exam:  General: NAD  HEENT: sclera clear, throat clear  Resp: CTAB  Cardiac: irreg, no m/r/g, no jvd  GI: abdomen soft, NT, ND  MSK:no LE edema  Neuro: no focal deficits  Psych: appropriate affect  Lab Results:  Basic Metabolic Panel:  Recent Labs Lab 05/23/16 0422 05/24/16 0545 05/25/16 0555 05/26/16 0548  NA 142 138 138 137  K 3.6 3.7 3.7 3.9  CL 106 104 101 100*  CO2 '28 26 28 28  '$ GLUCOSE 96 89 84 91  BUN '14 16 20 '$ 25*  CREATININE 1.03* 0.88 1.03* 0.98  CALCIUM 8.7* 8.5* 8.6* 8.9  MG 1.4* 1.7  --  1.7    Liver Function Tests:  Recent Labs Lab 05/22/16 1205  AST 61*  ALT 57*  ALKPHOS 94  BILITOT 0.8  PROT 6.4*  ALBUMIN 3.2*    CBC:  Recent Labs Lab 05/23/16 0422 05/24/16 0545 05/25/16 0555  WBC 5.3 5.1 4.6  HGB 12.0 12.1 12.7  HCT 36.1 36.7 38.4  MCV 92.3 92.7 93.0  PLT 222 237 250    Cardiac Enzymes:  Recent Labs Lab 05/22/16 1205 05/22/16 1539 05/22/16 2132  TROPONINI <0.03 <0.03 <0.03    BNP: No results for input(s): PROBNP in the last 8760 hours.  Coagulation:  Recent Labs Lab 05/22/16 1539  INR 0.95    ECG:   Medications:   Scheduled Medications: . apixaban  5  mg Oral BID  . carvedilol  12.5 mg Oral Once  . carvedilol  25 mg Oral BID WC  . furosemide  60 mg Intravenous Q8H  . magnesium oxide  400 mg Oral Daily  . multivitamin with minerals  1 tablet Oral Daily  . omega-3 acid ethyl esters  1 g Oral Daily  . pantoprazole  40 mg Oral Daily  . potassium chloride  40 mEq Oral BID  . sodium chloride flush  3 mL Intravenous Q12H  . umeclidinium bromide  1 puff Inhalation Daily     Infusions:     PRN Medications:  acetaminophen **OR** acetaminophen, albuterol, HYDROcodone-acetaminophen, naphazoline-glycerin, ondansetron **OR** ondansetron (ZOFRAN) IV     Assessment/Plan    1. Afib - new diagnosis this admit, though apparently had an isolated postoperative episode in 2013. Rate control with coreg 12.'5mg'$  bid, dilt '60mg'$  q8 hrs. Started on eliquis for stroke prevention.  - echo LVEF 40-45%, elevated LA pressure, mild MR, PASP 45. --  still with elevated rates. We will increase coreg t 37.'5mg'$  bid, given her body habtius this is a reasonable dose - she is on eliquis for anticoagulation - f/u heart rates and bp's. Could consider low dose dilt given she has only mild systolic dysfunction pending bp's. If no room for additional titration may have to consider TEE/DCCV, however her respiratory status would need to improve prior. Make NPO tonight in case.    2. Acute systolic/diastolic heart failure - evidence of systolic and diastolic dyfsunction by echo, presented in afib with RVR likely played a role - she is on lasix '60mg'$  IV q 8 hrs,  Negative 3 liters yesterday, negative 5.2 liters since admission. Renal function shows overall stable range. - ARB on hold to allow room with bp to titrate av nodal agents in setting of afib with RVR.  - anticipate one more day of IV diuretics.  - would pursue rate control and medical therapy, defer ischemic testing at this time.        Carlyle Dolly, M.D.

## 2016-05-26 NOTE — Progress Notes (Signed)
PROGRESS NOTE  Anita Weeks  JXB:[id number] DOB: 02/24/1951 DOA: 05/22/2016 PCP: No PCP Per Patient Outpatient Specialists:  Subjective: Feels much better, still have some lower extremity edema, still room for diuresis. Weight is down to 248 lbs. Heart rate is increased, cardiology is adjusting the beta blockers dose.  Brief Narrative:  Anita Weeks is a 65 y.o. female with medical history significant of LLL NSCLC with removal of 2013, has COPD, ex-smoker came to the hospital complaining about shortness of breath. Patient reported that she was having SOB for the past several days, seen in urgent care center several days ago, continues to have SOB and lower extremity edema so she came in today from work for further evaluation. She was found to be on A. fib with RVR.  Assessment & Plan:   Principal Problem:   Atrial fibrillation with RVR (HCC) Active Problems:   CHF (congestive heart failure) (HCC)   Acute pulmonary edema (HCC)   History of lung cancer   Asthma   Essential hypertension   Atrial fibrillation -Presented with A. fib with RVR, heart rate was up to 125. Patient has postoperative A. fib in 2013. -Initially was on Cardizem drip, this is switched to oral Cardizem, this is discontinued by cardiology. -Normal TSH, 2-D echo showed LVEF of 40-45% This patients CHA2DS2-VASc Score and unadjusted Ischemic Stroke Rate (% per year) is equal to 4.8 % stroke rate/year from a score of 4 1 point for each CHF, HTN, female gender and age between 75-74. Above score calculated as 1 point each if present [CHF, HTN, DM, Vascular=MI/PAD/Aortic Plaque, Age if 65-74, or Female] Above score calculated as 2 points each if present [Age > 75, or Stroke/TIA/TE]  -Started on Eliquis. -Cardiology increased Coreg to 37.5 mg twice a day. Heart rate is not controlled with activity, cardiology please advise.  Acute congestive heart failure -New-onset bibasilar pulmonary edema, lower extremity edema  consistent with acute CHF.  -2-D echo showed EF of 40-45% with diffuse hypokinesis,? Tachycardiomyopathy, HTN or less likely IHD, cardiology on board. -Serial troponins showed no evidence of ACS. No ischemic testing planned for this admission. -Daily weight, follow renal function closely. Keep leg elevated and restrict fluid and salt intake. -Currently on Lasix 60 mg IV every 8 hours since 10/29, follow renal function, weight continues to go down. -I will give 1 dose of Zaroxolyn 2.5 mg to augment diuresis, check BMP in a.m. Negative by 5 L since admission.  Acute bibasilar pulmonary edema -This is likely secondary to acute CHF, patient started on diuresis.  Essential hypertension -Was on metoprolol, hold Cozaar because of concurrent diuresis. -Currently only on Coreg, will restart Cozaar on discharge.  History of lung cancer -Follow-up with Dr. Wynelle Cleveland [facility]) as outpatient, no recent problems.  Hypomagnesemia -Patient is on oral supplements chronically, given IV magnesium oxide to keep mag above 2, check mag and a.m.  Hypokalemia -Improved with oral supplements.  Renal insufficiency -Slightly elevated creatinine at 1.1 on admission, follow renal function closely. Overall stable.   DVT prophylaxis: Eliquis Code Status: Full Code Family Communication:  Disposition Plan: Likely home in 2 days Diet: Diet Heart Room service appropriate? Yes; Fluid consistency: Thin; Fluid restriction: 1500 mL Fluid  Consultants:   Cardiology  Procedures:   2-D echo, results pending  Antimicrobials:   None   Objective: Vitals:   05/25/16 1400 05/25/16 2100 05/26/16 0500 05/26/16 0752  BP: (!) 136/91 123/75 113/64   Pulse: 82 90 71   Resp: 18 18  18   Temp: 98.2 F (36.8 C) 98.7 F (37.1 C) 98.6 F (37 C)   TempSrc:  Oral Oral   SpO2: 98% 95% 93% 99%  Weight:   114.3 kg (251 lb 15.8 oz)   Height:        Intake/Output Summary (Last 24 hours) at 05/26/16 0933 Last data filed  at 05/26/16 0856  Gross per 24 hour  Intake              601 ml  Output             2500 ml  Net            -1899 ml   Filed Weights   05/24/16 0525 05/25/16 0607 05/26/16 0500  Weight: 117 kg (257 lb 14.4 oz) 114.3 kg (251 lb 14.4 oz) 114.3 kg (251 lb 15.8 oz)    Examination: General exam: Appears calm and comfortable  Respiratory system: Clear to auscultation. Respiratory effort normal. Cardiovascular system: S1 & S2 heard, RRR. No JVD, murmurs, rubs, gallops or clicks. No pedal edema. Gastrointestinal system: Abdomen is nondistended, soft and nontender. No organomegaly or masses felt. Normal bowel sounds heard. Central nervous system: Alert and oriented. No focal neurological deficits. Extremities: Symmetric 5 x 5 power.There is +2 lower extremity edema. Skin: No rashes, lesions or ulcers Psychiatry: Judgement and insight appear normal. Mood & affect appropriate.   Data Reviewed: I have personally reviewed following labs and imaging studies  CBC:  Recent Labs Lab 05/22/16 1149 05/23/16 0422 05/24/16 0545 05/25/16 0555  WBC 7.9 5.3 5.1 4.6  NEUTROABS 5.7  --   --   --   HGB 13.9 12.0 12.1 12.7  HCT 41.1 36.1 36.7 38.4  MCV 91.9 92.3 92.7 93.0  PLT 273 222 237 683   Basic Metabolic Panel:  Recent Labs Lab 05/22/16 1149 05/23/16 0422 05/24/16 0545 05/25/16 0555 05/26/16 0548  NA 139 142 138 138 137  K 3.2* 3.6 3.7 3.7 3.9  CL 107 106 104 101 100*  CO2 '26 28 26 28 28  '$ GLUCOSE 114* 96 89 84 91  BUN '16 14 16 20 '$ 25*  CREATININE 1.12* 1.03* 0.88 1.03* 0.98  CALCIUM 8.7* 8.7* 8.5* 8.6* 8.9  MG  --  1.4* 1.7  --  1.7   GFR: Estimated Creatinine Clearance: 72.7 mL/min (by C-G formula based on SCr of 0.98 mg/dL). Liver Function Tests:  Recent Labs Lab 05/22/16 1205  AST 61*  ALT 57*  ALKPHOS 94  BILITOT 0.8  PROT 6.4*  ALBUMIN 3.2*   No results for input(s): LIPASE, AMYLASE in the last 168 hours. No results for input(s): AMMONIA in the last 168  hours. Coagulation Profile:  Recent Labs Lab 05/22/16 1539  INR 0.95   Cardiac Enzymes:  Recent Labs Lab 05/22/16 1205 05/22/16 1539 05/22/16 2132  TROPONINI <0.03 <0.03 <0.03   BNP (last 3 results) No results for input(s): PROBNP in the last 8760 hours. HbA1C: No results for input(s): HGBA1C in the last 72 hours. CBG: No results for input(s): GLUCAP in the last 168 hours. Lipid Profile: No results for input(s): CHOL, HDL, LDLCALC, TRIG, CHOLHDL, LDLDIRECT in the last 72 hours. Thyroid Function Tests: No results for input(s): TSH, T4TOTAL, FREET4, T3FREE, THYROIDAB in the last 72 hours. Anemia Panel: No results for input(s): VITAMINB12, FOLATE, FERRITIN, TIBC, IRON, RETICCTPCT in the last 72 hours. Urine analysis: No results found for: COLORURINE, APPEARANCEUR, LABSPEC, PHURINE, GLUCOSEU, HGBUR, BILIRUBINUR, KETONESUR, PROTEINUR, UROBILINOGEN, NITRITE, LEUKOCYTESUR Sepsis  Labs: '@LABRCNTIP'$ (procalcitonin:4,lacticidven:4)  ) Recent Results (from the past 240 hour(s))  MRSA PCR Screening     Status: None   Collection Time: 05/23/16 11:20 AM  Result Value Ref Range Status   MRSA by PCR NEGATIVE NEGATIVE Final    Comment:        The GeneXpert MRSA Assay (FDA approved for NASAL specimens only), is one component of a comprehensive MRSA colonization surveillance program. It is not intended to diagnose MRSA infection nor to guide or monitor treatment for MRSA infections.      Invalid input(s): PROCALCITONIN, [place]   Radiology Studies: No results found.      Scheduled Meds: . apixaban  5 mg Oral BID  . carvedilol  12.5 mg Oral Once  . carvedilol  12.5 mg Oral Once  . carvedilol  37.5 mg Oral BID WC  . furosemide  60 mg Intravenous Q8H  . magnesium oxide  400 mg Oral Daily  . multivitamin with minerals  1 tablet Oral Daily  . omega-3 acid ethyl esters  1 g Oral Daily  . pantoprazole  40 mg Oral Daily  . potassium chloride  40 mEq Oral BID  .  sodium chloride flush  3 mL Intravenous Q12H  . umeclidinium bromide  1 puff Inhalation Daily   Continuous Infusions:     LOS: 4 days    Time spent: 35 minutes    Jacalynn Buzzell A, MD [facility] Pager [phone]  If 7PM-7AM, please contact night-coverage [email or web address] Password [email or web address] 05/26/2016, 9:33 AM

## 2016-05-27 ENCOUNTER — Telehealth: Payer: Self-pay | Admitting: Orthopaedic Surgery

## 2016-05-27 DIAGNOSIS — I4891 Unspecified atrial fibrillation: Secondary | ICD-10-CM

## 2016-05-27 LAB — MAGNESIUM: MAGNESIUM: 1.9 mg/dL (ref 1.7–2.4)

## 2016-05-27 LAB — BASIC METABOLIC PANEL
Anion gap: 11 (ref 5–15)
BUN: 30 mg/dL — AB (ref 6–20)
CALCIUM: 9.5 mg/dL (ref 8.9–10.3)
CO2: 31 mmol/L (ref 22–32)
CREATININE: 1.24 mg/dL — AB (ref 0.44–1.00)
Chloride: 95 mmol/L — ABNORMAL LOW (ref 101–111)
GFR calc non Af Amer: 44 mL/min — ABNORMAL LOW (ref >60)
GFR, EST AFRICAN AMERICAN: 51 mL/min — AB (ref >60)
Glucose, Bld: 92 mg/dL (ref 65–99)
Potassium: 3.8 mmol/L (ref 3.5–5.1)
SODIUM: 137 mmol/L (ref 135–145)

## 2016-05-27 MED ORDER — CARVEDILOL 12.5 MG PO TABS: 37.5000 mg | ORAL_TABLET | Freq: Two times a day (BID) | ORAL | 0 refills | Status: AC

## 2016-05-27 MED ORDER — POTASSIUM CHLORIDE CRYS ER 20 MEQ PO TBCR: 20.0000 meq | EXTENDED_RELEASE_TABLET | Freq: Every day | ORAL | 0 refills | Status: AC

## 2016-05-27 MED ORDER — FUROSEMIDE 40 MG PO TABS: 40.0000 mg | ORAL_TABLET | Freq: Every day | ORAL | 0 refills | Status: AC

## 2016-05-27 MED ORDER — LOSARTAN POTASSIUM 50 MG PO TABS: 50.0000 mg | ORAL_TABLET | Freq: Every day | ORAL | 0 refills | Status: AC

## 2016-05-27 NOTE — Discharge Instructions (Signed)
Follow with Primary MD No PCP Per Patient in 7 days   Get CBC, CMP, 2 view Chest X ray checked  by Primary MD or SNF MD in 5-7 days ( we routinely change or add medications that can affect your baseline labs and fluid status, therefore we recommend that you get the mentioned basic workup next visit with your PCP, your PCP may decide not to get them or add new tests based on their clinical decision)   Activity: As tolerated with Full fall precautions use walker/cane & assistance as needed   Disposition Home     Diet:   Heart Healthy  Check your Weight same time everyday, if you gain over 2 pounds, or you develop in leg swelling, experience more shortness of breath or chest pain, call your Primary MD immediately. Follow Cardiac Low Salt Diet and 1.5 lit/day fluid restriction.   On your next visit with your primary care physician please Get Medicines reviewed and adjusted.   Please request your Prim.MD to go over all Hospital Tests and Procedure/Radiological results at the follow up, please get all Hospital records sent to your Prim MD by signing hospital release before you go home.   If you experience worsening of your admission symptoms, develop shortness of breath, life threatening emergency, suicidal or homicidal thoughts you must seek medical attention immediately by calling 911 or calling your MD immediately  if symptoms less severe.  You Must read complete instructions/literature along with all the possible adverse reactions/side effects for all the Medicines you take and that have been prescribed to you. Take any new Medicines after you have completely understood and accpet all the possible adverse reactions/side effects.   Do not drive, operate heavy machinery, perform activities at heights, swimming or participation in water activities or provide baby sitting services if your were admitted for syncope or siezures until you have seen by Primary MD or a Neurologist and advised to do so  again.  Do not drive when taking Pain medications.    Do not take more than prescribed Pain, Sleep and Anxiety Medications  Special Instructions: If you have smoked or chewed Tobacco  in the last 2 yrs please stop smoking, stop any regular Alcohol  and or any Recreational drug use.  Wear Seat belts while driving.   Please note  You were cared for by a hospitalist during your hospital stay. If you have any questions about your discharge medications or the care you received while you were in the hospital after you are discharged, you can call the unit and asked to speak with the hospitalist on call if the hospitalist that took care of you is not available. Once you are discharged, your primary care physician will handle any further medical issues. Please note that NO REFILLS for any discharge medications will be authorized once you are discharged, as it is imperative that you return to your primary care physician (or establish a relationship with a primary care physician if you do not have one) for your aftercare needs so that they can reassess your need for medications and monitor your lab values.

## 2016-05-27 NOTE — Telephone Encounter (Signed)
Patient was last seen by Dr. Luna Glasgow on 05-06-16 and was referred to [facility] for a hip replacement.  Patent called this morning and stated that she has been seen at [facility] and has been scheduled to have hip surgery there on 07-15-16.  She also stated that now she has been admitted to [facility] in room 315.  She states that the hospitalist has changed a lot of her medications but that he wont give her any pain medication.  She wants Dr. Luna Glasgow to write her something for pain.  I told her that I would have to speak to him regarding this.  I did speak to Dr. Luna Glasgow and he said that he could not do this.  She is now under the care of the doctor at [facility] and the hospitalist and they will have to prescribe this patient her medications.  I will inform the patient as she requested.

## 2016-05-27 NOTE — Discharge Summary (Addendum)
Anita Weeks DTO:[id number] DOB: 05-Dec-1950 DOA: 05/22/2016  PCP: No PCP Per Patient  Admit date: 05/22/2016  Discharge date: 05/27/2016  Admitted From: Home   Disposition:  Home   Recommendations for Outpatient Follow-up:   Follow up with PCP in 1-2 weeks  PCP Please obtain BMP/CBC, 2 view CXR in 1week,  (see Discharge instructions)   PCP Please follow up on the following pending results: None   Home Health: RN,PT   Equipment/Devices: None  Consultations: Cards Discharge Condition: Stable   CODE STATUS: Full   Diet Recommendation: Heart Healthy with fluid restriction   Chief Complaint  Patient presents with  . Shortness of Breath     Brief history of present illness from the day of admission and additional interim summary     Anita Weeks is a 65 y.o. female with medical history significant of LLL NSCLC with removal of 2013, has COPD, ex-smoker came to the hospital complaining about shortness of breath. Patient reported that she was having SOB for the past several days, seen in urgent care center several days ago, continues to have SOB and lower extremity edema so she came in today from work for further evaluation. She was found to be on A. fib with RVR.   Hospital issues addressed     Chronic A. 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 Patient seen by cardiology, initially required Cardizem drip, due to low EF transitioned to Cardizem, was also diuresed with IV Lasix, now she is in great control and completely free of shortness of breath. Not requiring any oxygen. Ablated in the hallway without any discomfort. Case discussed with cardiologist Dr. Roderic Palau branch patient stable for discharge on 40 of Lasix at home along with present dose Coreg, Cardizem  discontinued. Requested to follow with PCP within a week for a repeat BMP, weight and diuretic dose check. We will also follow with cardiology outpatient post discharge.  Mali Vasc 2 > 4 , Eliquis '5mg'$  PO BID called to pharmacy personally, CM informed.   Essential hypertension. Blood pressure stable on present dose Coreg, stop Norvasc, cut ARB dose into half. Follow with PCP for blood pressure monitoring.  History of lung cancer. Patient follows with Dr. Wynelle Cleveland as outpatient will follow with Dr. Wynelle Cleveland and PCP.  Mild AKI -  Reduced diuretic dose to oral. PCP to monitor. No CJD.   Discharge diagnosis     Principal Problem:   Atrial fibrillation with RVR (HCC) Active Problems:   CHF (congestive heart failure) (HCC)   Acute pulmonary edema (HCC)   History of lung cancer   Asthma   Essential hypertension   Acute combined systolic and diastolic heart failure [facility])    Discharge instructions    Discharge Instructions    Discharge instructions    Complete by:  As directed    Follow with Primary MD No PCP Per Patient in 7 days   Get CBC, CMP, 2 view Chest X ray checked  by Primary  MD or SNF MD in 5-7 days ( we routinely change or add medications that can affect your baseline labs and fluid status, therefore we recommend that you get the mentioned basic workup next visit with your PCP, your PCP may decide not to get them or add new tests based on their clinical decision)   Activity: As tolerated with Full fall precautions use walker/cane & assistance as needed   Disposition Home     Diet:   Heart Healthy   Check your Weight same time everyday, if you gain over 2 pounds, or you develop in leg swelling, experience more shortness of breath or chest pain, call your Primary MD immediately. Follow Cardiac Low Salt Diet and 1.5 lit/day fluid restriction.   On your next visit with your primary care physician please Get Medicines reviewed and adjusted.   Please request your Prim.MD to  go over all Hospital Tests and Procedure/Radiological results at the follow up, please get all Hospital records sent to your Prim MD by signing hospital release before you go home.   If you experience worsening of your admission symptoms, develop shortness of breath, life threatening emergency, suicidal or homicidal thoughts you must seek medical attention immediately by calling 911 or calling your MD immediately  if symptoms less severe.  You Must read complete instructions/literature along with all the possible adverse reactions/side effects for all the Medicines you take and that have been prescribed to you. Take any new Medicines after you have completely understood and accpet all the possible adverse reactions/side effects.   Do not drive, operate heavy machinery, perform activities at heights, swimming or participation in water activities or provide baby sitting services if your were admitted for syncope or siezures until you have seen by Primary MD or a Neurologist and advised to do so again.  Do not drive when taking Pain medications.    Do not take more than prescribed Pain, Sleep and Anxiety Medications  Special Instructions: If you have smoked or chewed Tobacco  in the last 2 yrs please stop smoking, stop any regular Alcohol  and or any Recreational drug use.  Wear Seat belts while driving.   Please note  You were cared for by a hospitalist during your hospital stay. If you have any questions about your discharge medications or the care you received while you were in the hospital after you are discharged, you can call the unit and asked to speak with the hospitalist on call if the hospitalist that took care of you is not available. Once you are discharged, your primary care physician will handle any further medical issues. Please note that NO REFILLS for any discharge medications will be authorized once you are discharged, as it is imperative that you return to your primary care  physician (or establish a relationship with a primary care physician if you do not have one) for your aftercare needs so that they can reassess your need for medications and monitor your lab values.   Increase activity slowly    Complete by:  As directed      Mali Vasc 2 > 4 , Eliquis '5mg'$  PO BID called to pharmacy personally, CM informed.  Discharge Medications     Medication List    STOP taking these medications   amLODipine 5 MG tablet Commonly known as:  NORVASC   metoprolol 50 MG tablet Commonly known as:  LOPRESSOR   Potassium 99 MG Tabs     TAKE these medications   BREO ELLIPTA  200-25 MCG/INH Aepb Generic drug:  fluticasone furoate-vilanterol 1 puff daily.   carvedilol 12.5 MG tablet Commonly known as:  COREG Take 3 tablets (37.5 mg total) by mouth 2 (two) times daily with a meal.   FISH OIL PO Take 1 tablet by mouth daily.   furosemide 40 MG tablet Commonly known as:  LASIX Take 1 tablet (40 mg total) by mouth daily. Can take an extra pill if weight more than 2 pounds in 24 hours.   INCRUSE ELLIPTA 62.5 MCG/INH Aepb Generic drug:  umeclidinium bromide 1 puff daily.   losartan 50 MG tablet Commonly known as:  COZAAR Take 1 tablet (50 mg total) by mouth daily. What changed:  medication strength  how much to take  how to take this   MAGNESIUM PO Take 100 mg by mouth daily.   multivitamin tablet Take 1 tablet by mouth daily.   omeprazole 20 MG capsule Commonly known as:  PRILOSEC Take 1 capsule by mouth at bedtime.   oxyCODONE-acetaminophen 5-325 MG tablet Commonly known as:  PERCOCET/ROXICET Take 1 tablet by mouth every 6 (six) hours as needed for moderate pain or severe pain (Must last 14 days.Do not drive or operate machinery while taking this medicine).   potassium chloride SA 20 MEQ tablet Commonly known as:  K-DUR,KLOR-CON Take 1 tablet (20 mEq total) by mouth daily.   predniSONE 10 MG (21) Tbpk tablet Commonly known as:  STERAPRED  UNI-PAK 21 TAB Take six pills the first day; then 5 pills the next day; then 4 pills the next day; then 3 pills the next day; then 2 pills the next day and the last day take one pill by mouth.   SYSTANE OP Apply 1 drop to eye daily.       Follow-up Information    Your PCP. Schedule an appointment as soon as possible for a visit in 1 week(s).        Carlyle Dolly, MD. Schedule an appointment as soon as possible for a visit in 1 week(s).   Specialty:  Cardiology Contact information: [address] [place] [address] [phone]        Carlyle Dolly, MD .   Specialty:  Cardiology Contact information: [place] [address] [phone]           Major procedures and Radiology Reports - PLEASE review detailed and final reports thoroughly  -      TTE  Left ventricle: The cavity size was normal. There was moderate   concentric hypertrophy. Systolic function was mildly to   moderately reduced. The estimated ejection fraction was in the   range of 40% to 45%. Diffuse hypokinesis. Doppler parameters are   consistent with high ventricular filling pressure. - Mitral valve: Calcified annulus. There was mild regurgitation. - Left atrium: The atrium was moderately dilated. - Pulmonary arteries: PA peak pressure: 45 mm Hg (S).  Impressions:  - The right ventricular systolic pressure was increased consistent   with moderate pulmonary hypertension.    Dg Chest 2 View  Result Date: 05/22/2016 CLINICAL DATA:  Shortness of breath since last night with productive cough. EXAM: CHEST  2 VIEW COMPARISON:  None. FINDINGS: Lungs are adequately inflated and demonstrate small amount of bilateral pleural fluid. There is patchy airspace opacification over the right middle lobe/ lingula possibly a pneumonia. Mild prominence of the perihilar markings. Mild cardiomegaly. Minimal calcified plaque over the aortic arch. There are degenerative changes of the  spine. IMPRESSION:  Patchy midlung opacification suggesting a pneumonia. Small amount of bilateral pleural fluid. Mild cardiomegaly with suggestion mild vascular congestion. Electronically Signed   By: Marin Olp M.D.   On: 05/22/2016 12:16    Micro Results     Recent Results (from the past 240 hour(s))  MRSA PCR Screening     Status: None   Collection Time: 05/23/16 11:20 AM  Result Value Ref Range Status   MRSA by PCR NEGATIVE NEGATIVE Final    Comment:        The GeneXpert MRSA Assay (FDA approved for NASAL specimens only), is one component of a comprehensive MRSA colonization surveillance program. It is not intended to diagnose MRSA infection nor to guide or monitor treatment for MRSA infections.     Today   Subjective    Anita Weeks today has no headache,no chest abdominal pain,no new weakness tingling or numbness, feels much better wants to go home today.    Objective   Blood pressure 107/82, pulse 73, temperature 97.9 F (36.6 C), temperature source Oral, resp. rate 18, height '5\' 7"'$  (1.702 m), weight 113.6 kg (250 lb 7.1 oz), SpO2 90 %.   Intake/Output Summary (Last 24 hours) at 05/27/16 0955 Last data filed at 05/27/16 0535  Gross per 24 hour  Intake              240 ml  Output             1950 ml  Net            -1710 ml    Exam Awake Alert, Oriented x 3, No new F.N deficits, Normal affect Anita Weeks,PERRAL Supple Neck,No JVD, No cervical lymphadenopathy appriciated.  Symmetrical Chest wall movement, Good air movement bilaterally, CTAB RRR,No Gallops,Rubs or new Murmurs, No Parasternal Heave +ve B.Sounds, Abd Soft, Non tender, No organomegaly appriciated, No rebound -guarding or rigidity. No Cyanosis, Clubbing or edema, No new Rash or bruise   Data Review   CBC w Diff: Lab Results  Component Value Date   WBC 4.6 05/25/2016   HGB 12.7 05/25/2016   HCT 38.4 05/25/2016   PLT 250 05/25/2016   LYMPHOPCT 20 05/22/2016   MONOPCT 7 05/22/2016   EOSPCT 1  05/22/2016   BASOPCT 0 05/22/2016    CMP: Lab Results  Component Value Date   NA 137 05/27/2016   K 3.8 05/27/2016   CL 95 (L) 05/27/2016   CO2 31 05/27/2016   BUN 30 (H) 05/27/2016   CREATININE 1.24 (H) 05/27/2016   PROT 6.4 (L) 05/22/2016   ALBUMIN 3.2 (L) 05/22/2016   BILITOT 0.8 05/22/2016   ALKPHOS 94 05/22/2016   AST 61 (H) 05/22/2016   ALT 57 (H) 05/22/2016  .   Total Time in preparing paper work, data evaluation and todays exam - 35 minutes  Thurnell Lose M.D on 05/27/2016 at 9:55 AM  [facility]   Office  [phone]

## 2016-05-27 NOTE — Progress Notes (Signed)
Pt. May return to work Friday May 29, 2016 per Dr. Candiss Norse.

## 2016-06-09 ENCOUNTER — Encounter: Payer: Medicare Other | Admitting: Adult Health

## 2016-06-09 ENCOUNTER — Encounter: Payer: Self-pay | Admitting: Adult Health

## 2016-06-09 NOTE — Progress Notes (Deleted)
Cardiology Office Note   Date:  06/09/2016   ID:  Anita Weeks, DOB 1950/11/29, MRN [id number]  PCP:  No PCP Per Patient  Cardiologist: [place]/  Jory Sims, NP   No chief complaint on file.     History of Present Illness: Anita Weeks is a 65 y.o. female who presents for ongoing assessment and management of atrial fibrillation, CHADSVASC score of 3, hypertension, chronic dyspnea, systolic dysfunction with EF of 45% per echocardiogram on recent hospital admission November 2017. The patient had evidence of mild CHF and was started on by mouth Lasix, carvedilol, ELIQUIS 5 mg twice a day. Other history includes lung cancer and is followed at [facility] status post left lower lobe resection.  She is here for post hospitalization follow-up.     Past Medical History:  Diagnosis Date  . Allergic rhinitis   . Anxiety   . COPD (chronic obstructive pulmonary disease) ([address])   . Essential hypertension   . History of pulmonary embolus (PE)   . Hyperlipidemia   . Lung cancer ([place])    Non-small cell lung cancer, left lower lobe - surgery 2013 ([facility])  . OSA (obstructive sleep apnea)   . Postoperative atrial fibrillation [facility])     Past Surgical History:  Procedure Laterality Date  . BREAST BIOPSY Left   . CESAREAN SECTION    . LOBECTOMY Left 2013   lower  . Reversal of tubal ligation       Current Outpatient Prescriptions  Medication Sig Dispense Refill  . carvedilol (COREG) 12.5 MG tablet Take 3 tablets (37.5 mg total) by mouth 2 (two) times daily with a meal. 120 tablet 0  . fluticasone furoate-vilanterol (BREO ELLIPTA) 200-25 MCG/INH AEPB 1 puff daily.    . furosemide (LASIX) 40 MG tablet Take 1 tablet (40 mg total) by mouth daily. Can take an extra pill if weight more than 2 pounds in 24 hours. 30 tablet 0  . losartan (COZAAR) 50 MG tablet Take 1 tablet (50 mg total) by mouth daily. 30 tablet 0  . MAGNESIUM PO Take 100 mg by mouth daily.     .  Multiple Vitamin (MULTIVITAMIN) tablet Take 1 tablet by mouth daily.    . Omega-3 Fatty Acids (FISH OIL PO) Take 1 tablet by mouth daily.     Marland Kitchen omeprazole (PRILOSEC) 20 MG capsule Take 1 capsule by mouth at bedtime.     Marland Kitchen oxyCODONE-acetaminophen (PERCOCET/ROXICET) 5-325 MG tablet Take 1 tablet by mouth every 6 (six) hours as needed for moderate pain or severe pain (Must last 14 days.Do not drive or operate machinery while taking this medicine). 60 tablet 0  . Polyethyl Glycol-Propyl Glycol (SYSTANE OP) Apply 1 drop to eye daily.    . potassium chloride SA (K-DUR,KLOR-CON) 20 MEQ tablet Take 1 tablet (20 mEq total) by mouth daily. 30 tablet 0  . predniSONE (STERAPRED UNI-PAK 21 TAB) 10 MG (21) TBPK tablet Take six pills the first day; then 5 pills the next day; then 4 pills the next day; then 3 pills the next day; then 2 pills the next day and the last day take one pill by mouth. 21 tablet 1  . umeclidinium bromide (INCRUSE ELLIPTA) 62.5 MCG/INH AEPB 1 puff daily.     No current facility-administered medications for this visit.     Allergies:   Patient has no known allergies.    Social History:  The patient  reports that she has quit smoking. She has never used  smokeless tobacco. She reports that she does not drink alcohol or use drugs.   Family History:  The patient's family history includes Heart disease in her father; Stroke in her father; Thyroid disease in her sister.    ROS: All other systems are reviewed and negative. Unless otherwise mentioned in H&P    PHYSICAL EXAM: VS:  There were no vitals taken for this visit. , BMI There is no height or weight on file to calculate BMI. GEN: Well nourished, well developed, in no acute distress  HEENT: normal  Neck: no JVD, carotid bruits, or masses Cardiac: ***RRR; no murmurs, rubs, or gallops,no edema  Respiratory:  clear to auscultation bilaterally, normal work of breathing GI: soft, nontender, nondistended, + BS MS: no deformity or  atrophy  Skin: warm and dry, no rash Neuro:  Strength and sensation are intact Psych: euthymic mood, full affect   EKG:  EKG {ACTION; IS/IS [id number]} ordered today. The ekg ordered today demonstrates ***   Recent Labs: 05/22/2016: ALT 57; TSH 0.955 05/23/2016: B Natriuretic Peptide 191.0 05/25/2016: Hemoglobin 12.7; Platelets 250 05/27/2016: BUN 30; Creatinine, Ser 1.24; Magnesium 1.9; Potassium 3.8; Sodium 137    Lipid Panel    Component Value Date/Time   CHOL 272 (H) 05/26/2016 0548   TRIG 242 (H) 05/26/2016 0548   HDL 40 (L) 05/26/2016 0548   CHOLHDL 6.8 05/26/2016 0548   VLDL 48 (H) 05/26/2016 0548   LDLCALC 184 (H) 05/26/2016 0548      Wt Readings from Last 3 Encounters:  05/27/16 250 lb 7.1 oz (113.6 kg)  05/06/16 257 lb (116.6 kg)  04/15/16 257 lb (116.6 kg)      Other studies Reviewed: Additional studies/ records that were reviewed today include: ***. Review of the above records demonstrates: ***   ASSESSMENT AND PLAN:  1.  ***   Current medicines are reviewed at length with the patient today.    Labs/ tests ordered today include: *** No orders of the defined types were placed in this encounter.    Disposition:   FU with *** in {gen number [id number]:797282} {TIME; UNITS DAY/WEEK/MONTH:19136}   Signed, Jory Sims, NP  06/09/2016 7:06 AM    [facility]. [address] [place], [place] [address] Phone: [phone]; Fax: [phone]

## 2016-07-27 DEATH — deceased

## 2018-03-12 IMAGING — DX DG CHEST 2V
2 series · 2 of 2 positions shown · non-contrast
Comparison: None.

CLINICAL DATA: Shortness of breath since last night with productive
cough.

EXAM:
CHEST  2 VIEW

[chest lat]
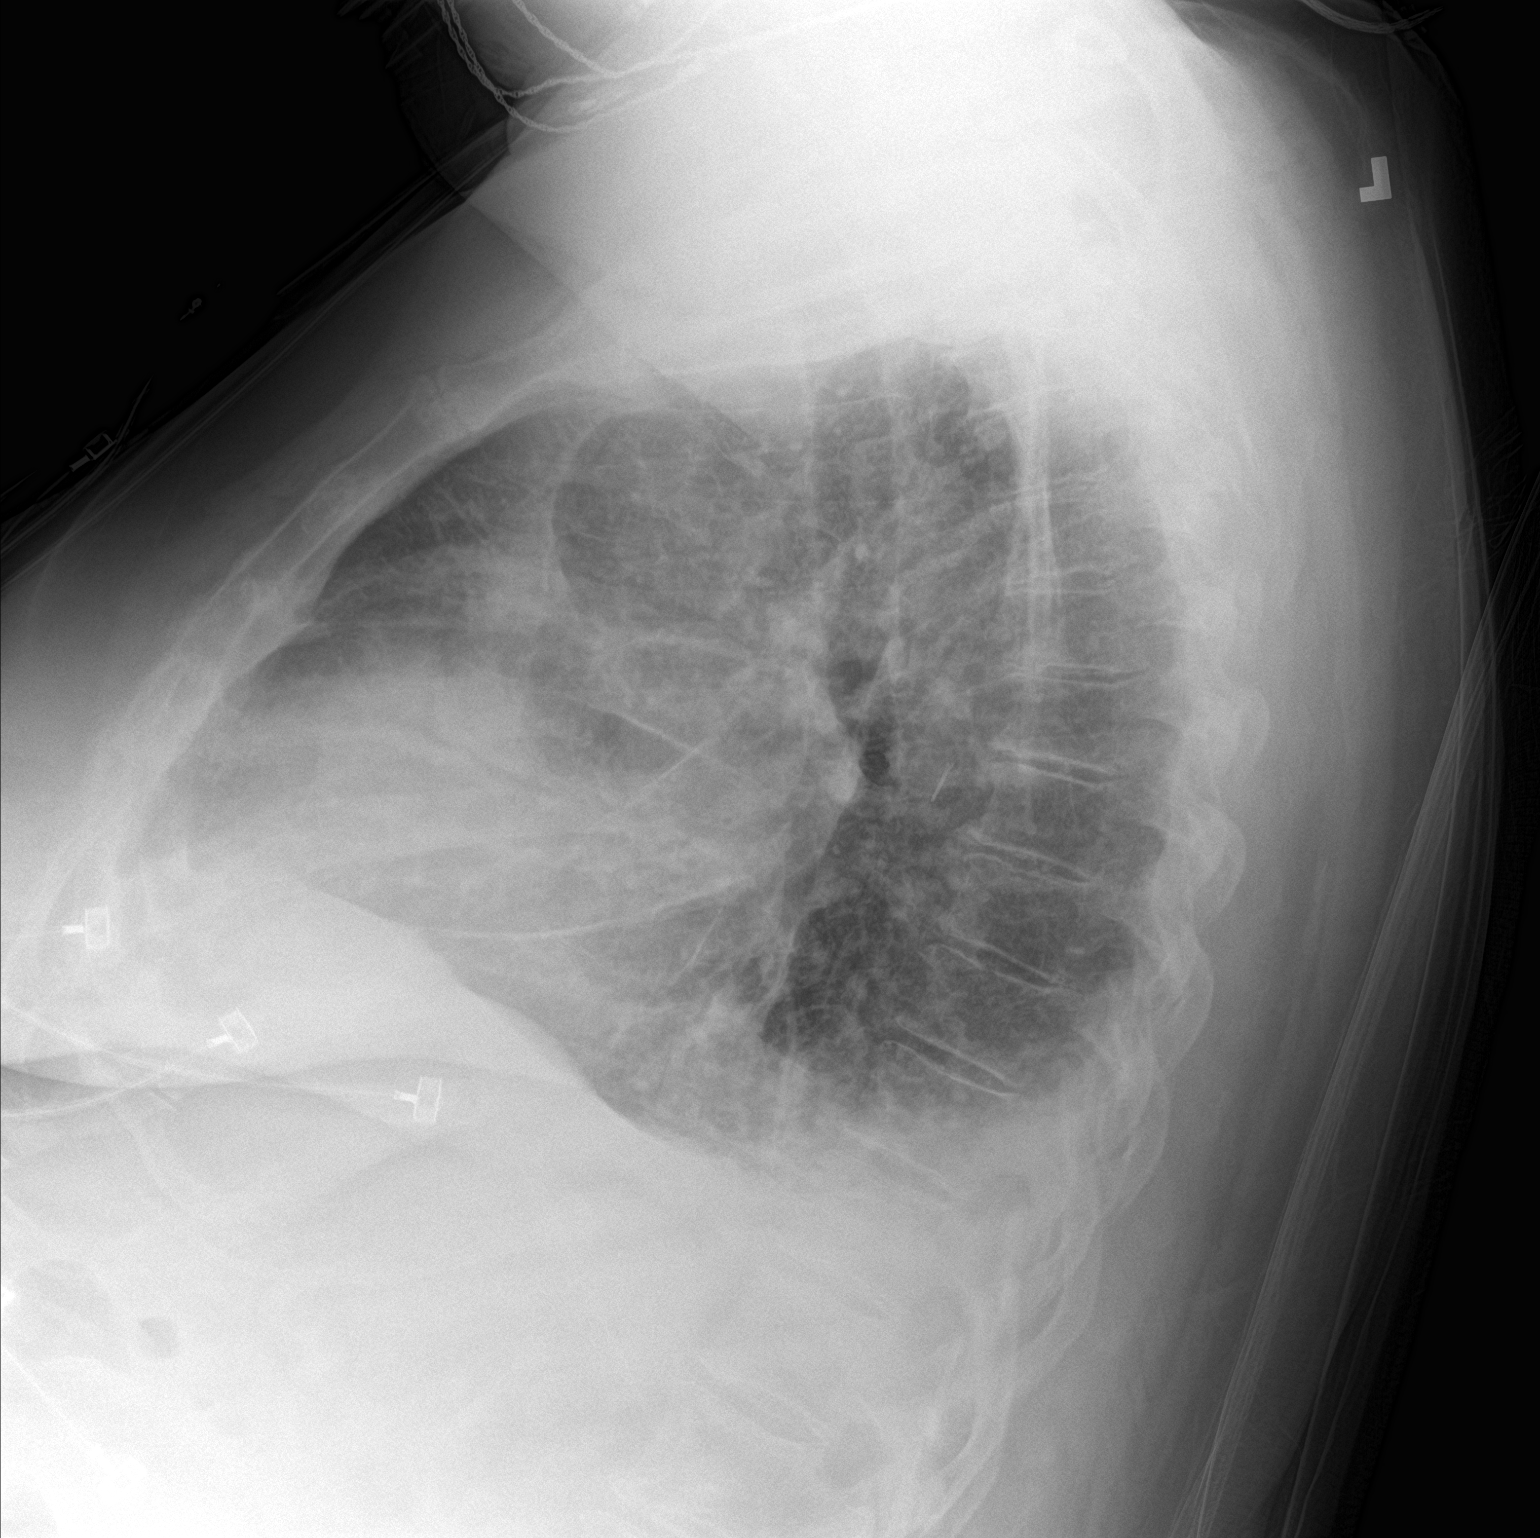

[chest ap]
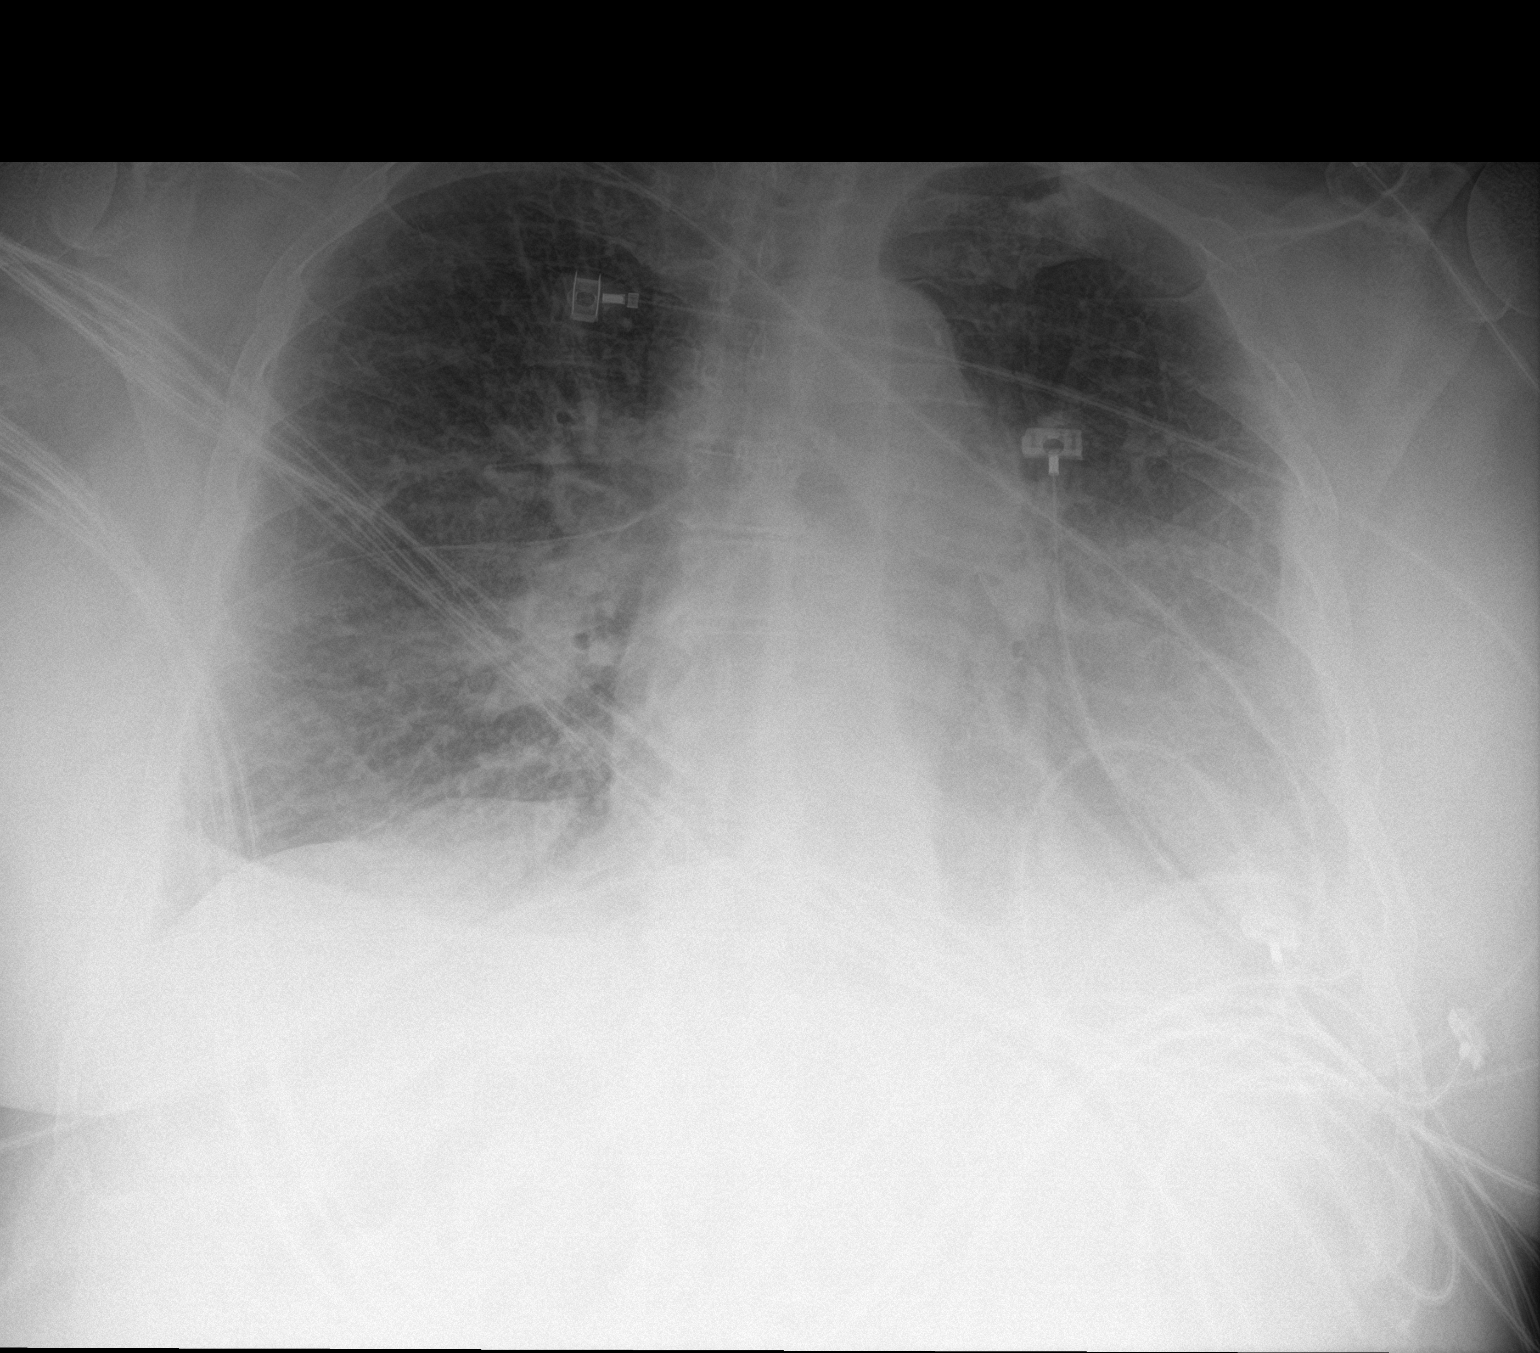

[2 of 2 positions shown; findings below may reference images not displayed]

FINDINGS: Lungs are adequately inflated and demonstrate small amount of
bilateral pleural fluid. There is patchy airspace opacification over
the right middle lobe/ lingula possibly a pneumonia. Mild prominence
of the perihilar markings. Mild cardiomegaly. Minimal calcified
plaque over the aortic arch. There are degenerative changes of the
spine.
IMPRESSION: Patchy midlung opacification suggesting a pneumonia. Small amount of
bilateral pleural fluid.

Mild cardiomegaly with suggestion mild vascular congestion.
